# Patient Record
Sex: Male | Born: 1985 | Race: Black or African American | Hispanic: No | Marital: Single | Smoking: Former smoker
Health system: Southern US, Community
[De-identification: ages and names within clinical notes are randomized; demographics above are authoritative.]

## PROBLEM LIST (undated history)

## (undated) DIAGNOSIS — K5190 Ulcerative colitis, unspecified, without complications: Secondary | ICD-10-CM

## (undated) HISTORY — PX: ANKLE SURGERY: SHX546

---
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
## 2007-10-02 ENCOUNTER — Emergency Department (HOSPITAL_COMMUNITY): Admission: EM | Admit: 2007-10-02 | Discharge: 2007-10-02 | Payer: Self-pay | Admitting: Emergency Medicine

## 2008-01-08 ENCOUNTER — Emergency Department (HOSPITAL_COMMUNITY): Admission: EM | Admit: 2008-01-08 | Discharge: 2008-01-08 | Payer: Self-pay | Admitting: Emergency Medicine

## 2008-01-10 ENCOUNTER — Encounter: Payer: Self-pay | Admitting: Gastroenterology

## 2008-01-12 ENCOUNTER — Emergency Department (HOSPITAL_COMMUNITY): Admission: EM | Admit: 2008-01-12 | Discharge: 2008-01-12 | Payer: Self-pay | Admitting: Emergency Medicine

## 2008-01-14 ENCOUNTER — Encounter: Payer: Self-pay | Admitting: Gastroenterology

## 2008-04-03 ENCOUNTER — Emergency Department (HOSPITAL_COMMUNITY): Admission: EM | Admit: 2008-04-03 | Discharge: 2008-04-04 | Payer: Self-pay | Admitting: Emergency Medicine

## 2009-02-23 ENCOUNTER — Emergency Department (HOSPITAL_COMMUNITY): Admission: EM | Admit: 2009-02-23 | Discharge: 2009-02-23 | Payer: Self-pay | Admitting: Emergency Medicine

## 2009-02-26 ENCOUNTER — Emergency Department (HOSPITAL_COMMUNITY): Admission: EM | Admit: 2009-02-26 | Discharge: 2009-02-26 | Payer: Self-pay | Admitting: Emergency Medicine

## 2009-03-03 ENCOUNTER — Emergency Department (HOSPITAL_COMMUNITY): Admission: EM | Admit: 2009-03-03 | Discharge: 2009-03-04 | Payer: Self-pay | Admitting: Emergency Medicine

## 2009-03-05 ENCOUNTER — Ambulatory Visit: Payer: Self-pay | Admitting: Family Medicine

## 2009-03-12 ENCOUNTER — Ambulatory Visit: Payer: Self-pay | Admitting: Family Medicine

## 2009-03-12 DIAGNOSIS — N453 Epididymo-orchitis: Secondary | ICD-10-CM | POA: Insufficient documentation

## 2009-04-22 ENCOUNTER — Telehealth: Payer: Self-pay | Admitting: Gastroenterology

## 2009-07-26 ENCOUNTER — Emergency Department (HOSPITAL_COMMUNITY): Admission: EM | Admit: 2009-07-26 | Discharge: 2009-07-26 | Payer: Self-pay | Admitting: Emergency Medicine

## 2009-08-12 ENCOUNTER — Emergency Department (HOSPITAL_COMMUNITY): Admission: EM | Admit: 2009-08-12 | Discharge: 2009-08-13 | Payer: Self-pay | Admitting: Emergency Medicine

## 2009-11-03 IMAGING — CT CT PELVIS W/ CM
3 of 5 series · 13 of 36 positions shown, 19 images · IV contrast (OMNI 300/WATER & 100 ML OMNI 300)
Comparison: The lung bases are clear.

CT ABDOMEN

CLINICAL DATA: Abdominal pain.  21-year-old with abdominal pain
and blood in stools 3-4 days ago.

CT ABDOMEN AND PELVIS WITH CONTRAST
TECHNIQUE: Multidetector CT imaging of the abdomen and pelvis was
performed using the standard protocol following bolus
administration of intravenous contrast.
Contrast: 100 ml 4mnipaque-SSS none

[Series 2: routine abdomen · axial · 0.70mm/px · z∈[-399,-84]mm · 8 of 83 slices shown, 13 images]
[im 10/83  soft-tissue]
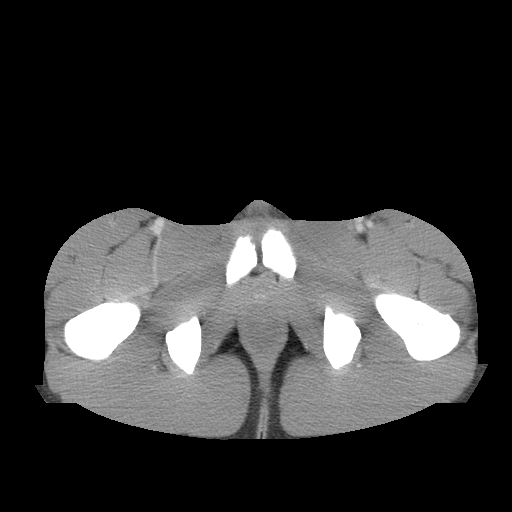
[im 10/83  bone]
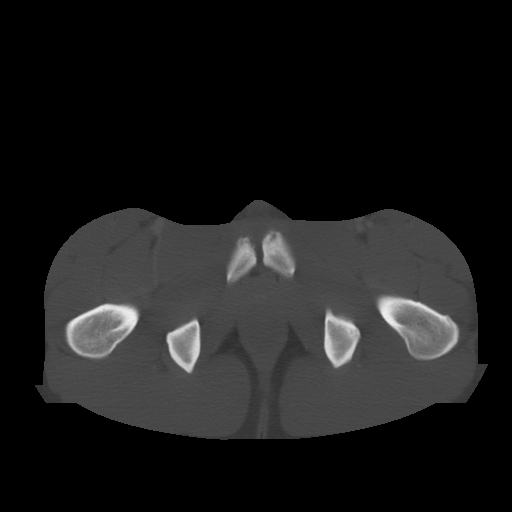
[im 19/83  soft-tissue]
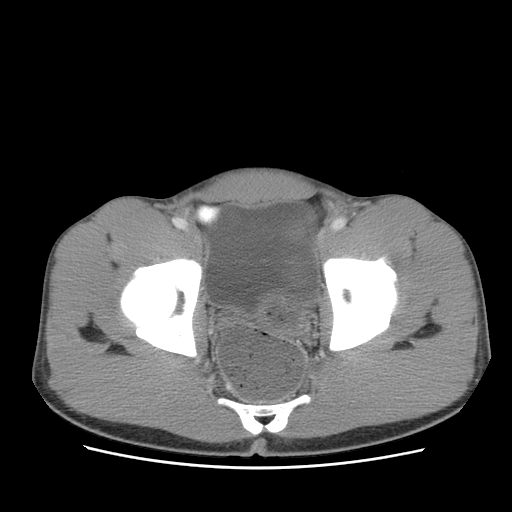
[im 28/83  soft-tissue]
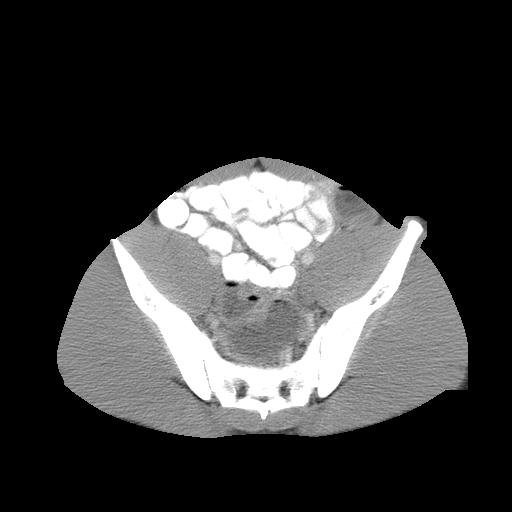
[im 37/83  soft-tissue]
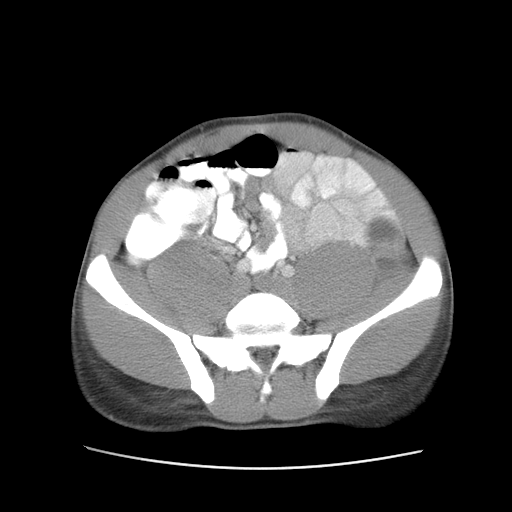
[im 46/83  soft-tissue]
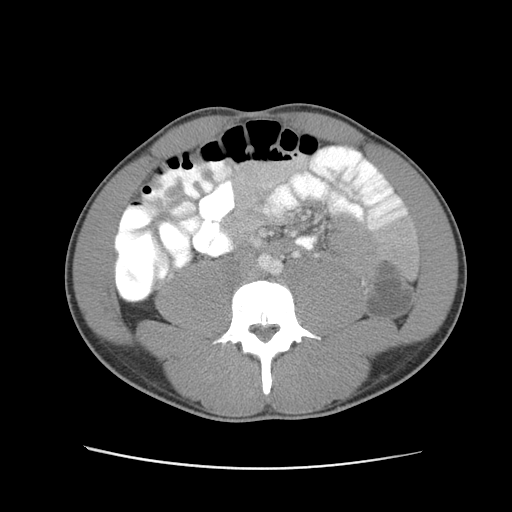
[im 46/83  lung]
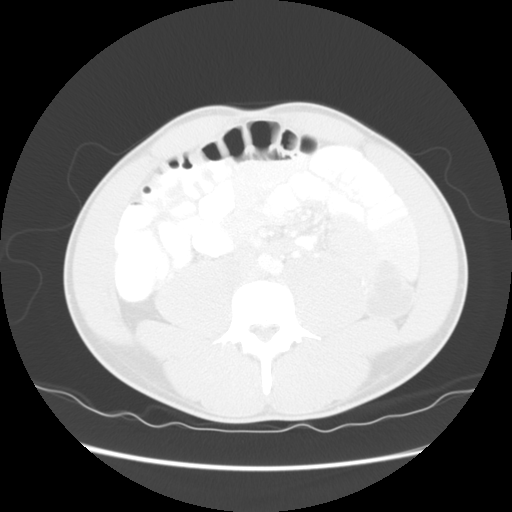
[im 55/83  soft-tissue]
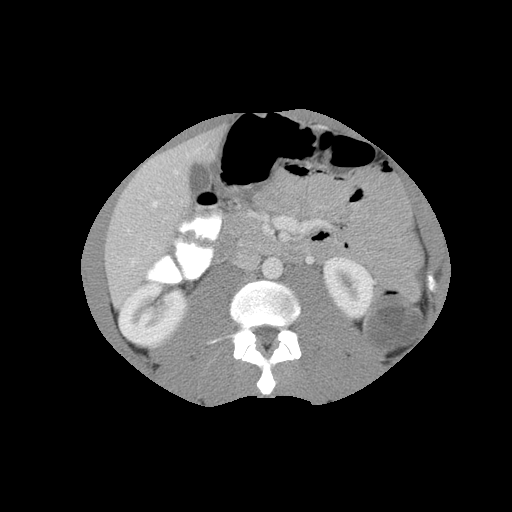
[im 55/83  lung]
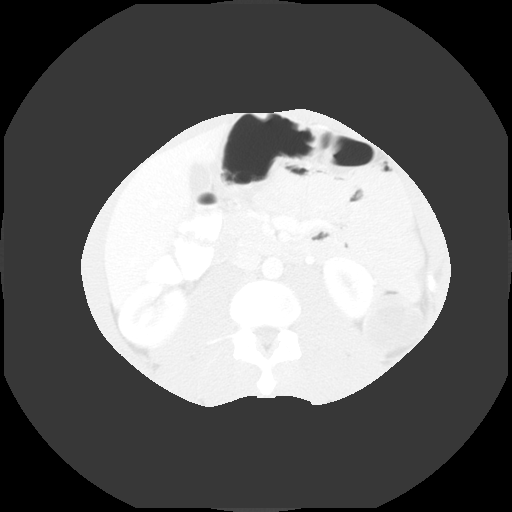
[im 64/83  soft-tissue]
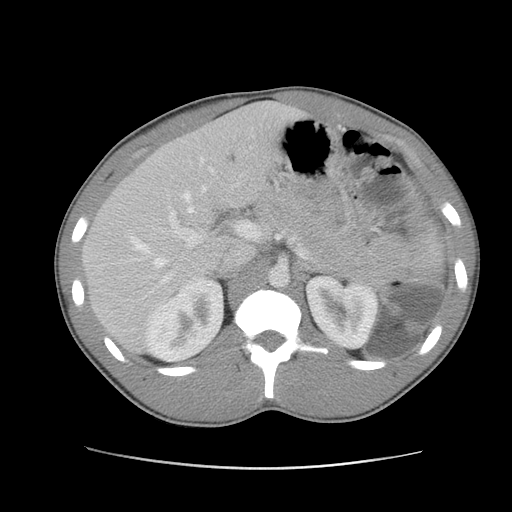
[im 64/83  lung]
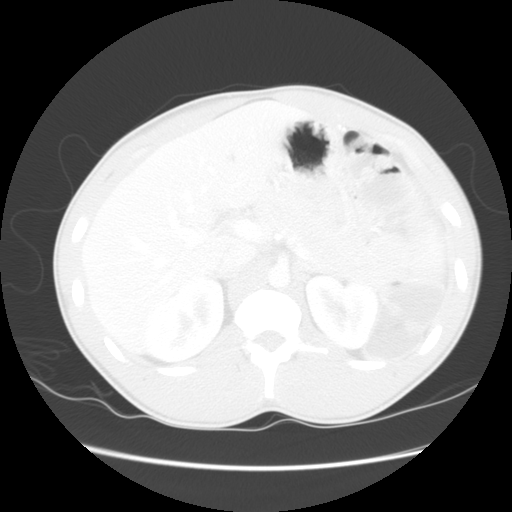
[im 73/83  soft-tissue]
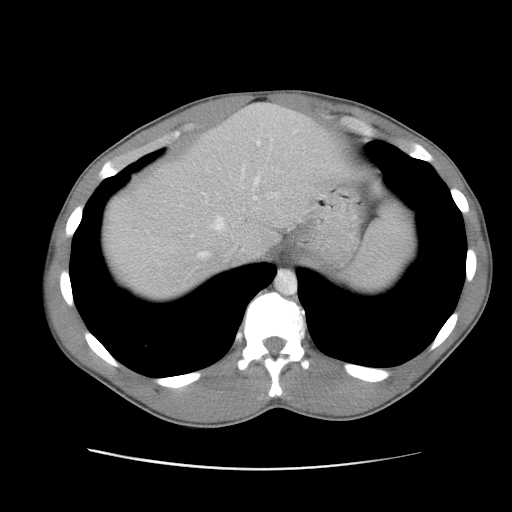
[im 73/83  lung]
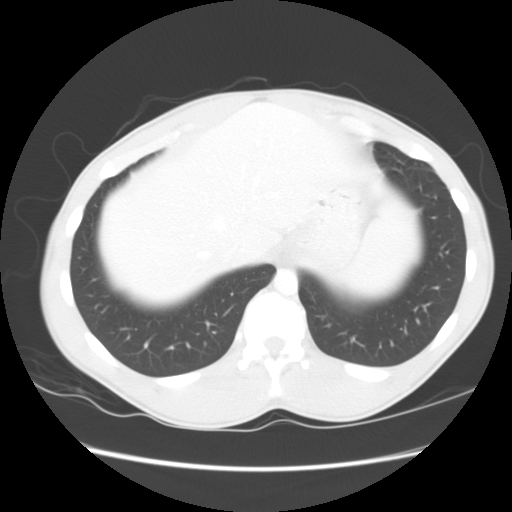

[Series 400: reformatted · sagittal · 0.82mm/px · 1 of 153 slices shown, 2 images (1 of 2)]
[im 51/153  soft-tissue]
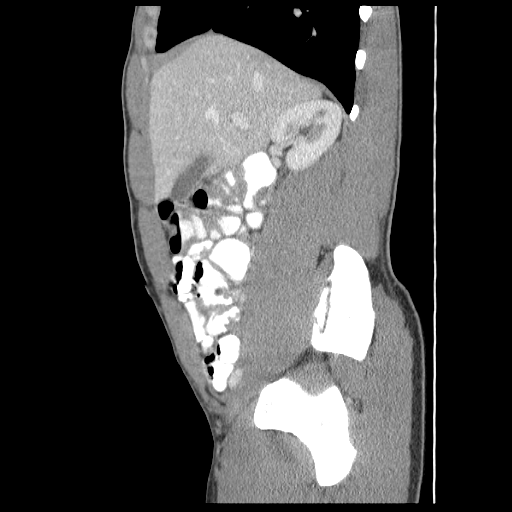
[im 51/153  bone]
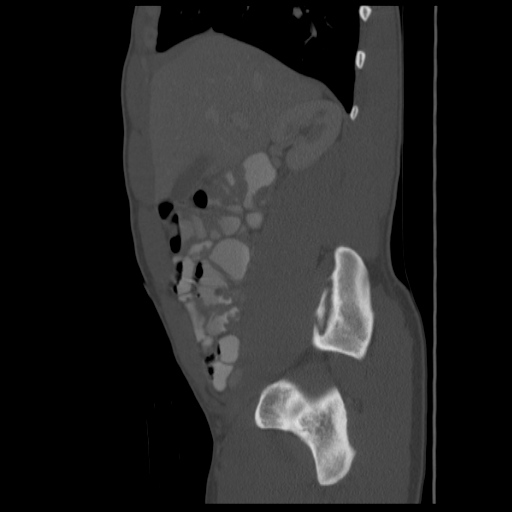

[Series 401: reformatted · coronal · 0.82mm/px · 4 of 129 slices shown (2 of 2)]
[im 10/129  soft-tissue]
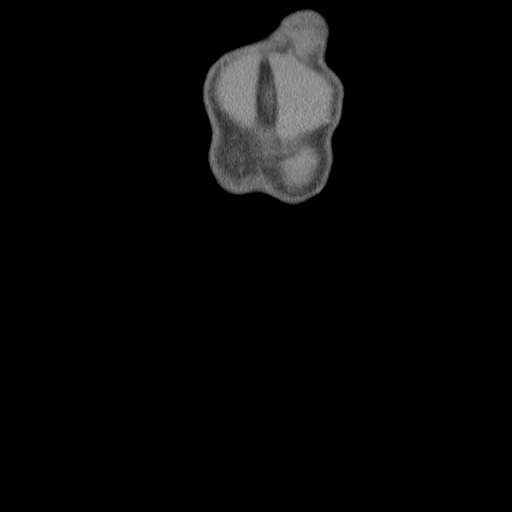
[im 28/129  soft-tissue]
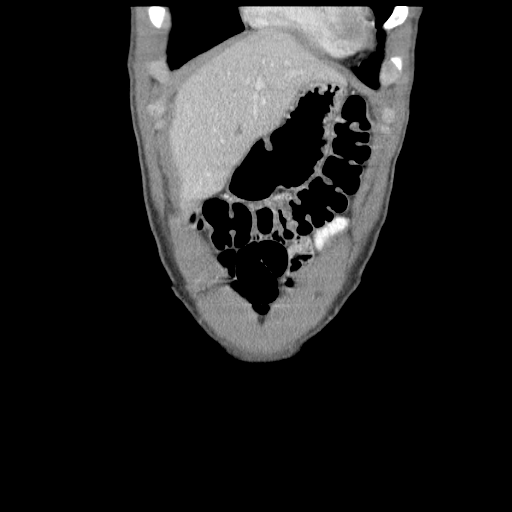
[im 46/129  soft-tissue]
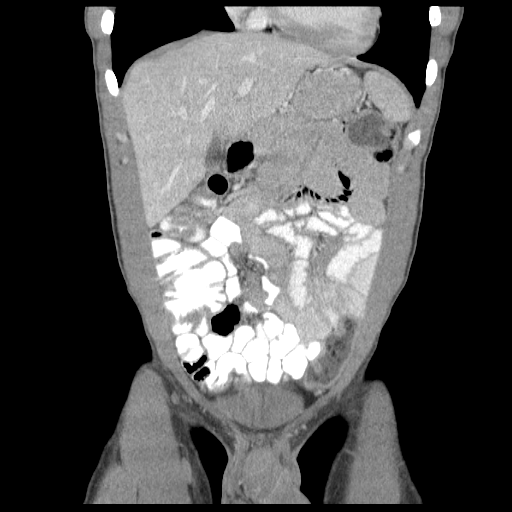
[im 55/129  soft-tissue]
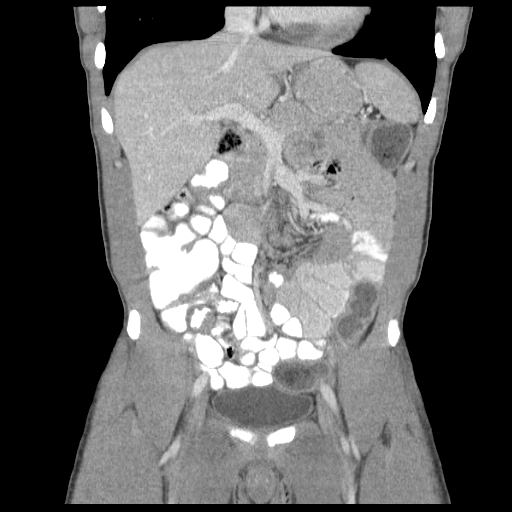

[13 of 36 positions shown; findings below may reference images not displayed]

FINDINGS: The liver, gallbladder, adrenal glands, kidneys, and pancreas are
normal.

Evaluation for acute inflammatory changes is somewhat limited due
to a marked paucity of intra-abdominal fat in this young, thin
patient.

Stomach is unremarkable.  Small bowel loops are normal in caliber
and no bowel wall thickening is appreciated.  The distal transverse
colon through the descending colon do not contain oral contrast but
are fluid-filled, which can be seen in the setting of diarrhea.
There is no colonic wall thickening.  No acute bony abnormalities
appreciated.  Incidentally noted is a limbus vertebra at L5.
IMPRESSION: 1.  Fluid-filled colon, which can be seen in the setting of
diarrhea.  No inflammatory changes related to the bowel are
identified.

CT PELVIS
FINDINGS: The sigmoid colon contains formed stool and fluid but
there is no evidence of wall thickening.  The urinary bladder is
normal.  Pelvic small bowel loops are normal in caliber and wall
thickness.  The appendix is not definitely identified; however no
acute inflammatory changes are appreciated in the right lower
quadrant and to suggest acute appendicitis.  No free fluid or
adenopathy is seen.  The bony pelvis is unremarkable
IMPRESSION: 1.  No acute findings in the pelvis.

## 2010-01-08 ENCOUNTER — Emergency Department (HOSPITAL_COMMUNITY): Admission: EM | Admit: 2010-01-08 | Discharge: 2010-01-08 | Payer: Self-pay | Admitting: Emergency Medicine

## 2011-03-13 IMAGING — US US ART/VEN ABD/PELV/SCROTUM DOPPLER COMPLETE
1 series · 14 of 25 positions shown · non-contrast
Comparison: Pelvic CT 04/04/2008.

CLINICAL DATA: Scrotal pain and swelling.

SCROTAL ULTRASOUND
DOPPLER ULTRASOUND OF THE TESTICLES
TECHNIQUE: Complete ultrasound examination of the testicles,
epididymis, and other scrotal structures was performed.  Color and
spectral Doppler ultrasound were also utilized to evaluate blood
flow to the testicles.

[Series 1: us art/ven abd/pelv/scrotum doppler complete · 0.08mm/px · 14 of 57 slices shown]
[im 1/57]
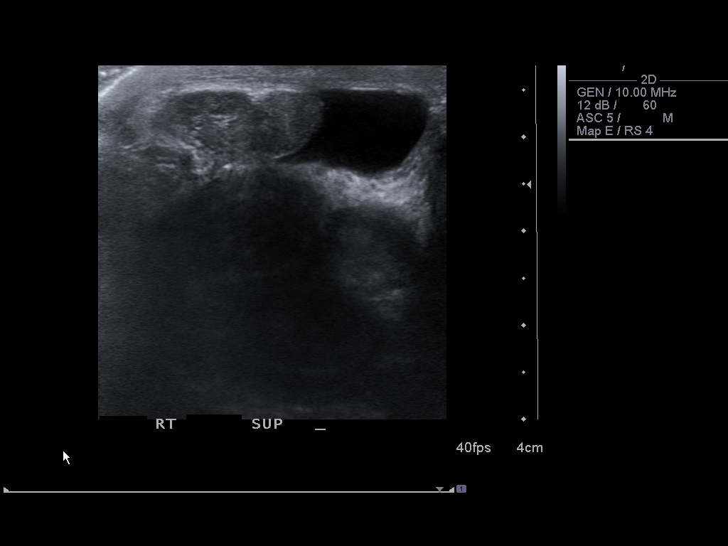
[im 5/57]
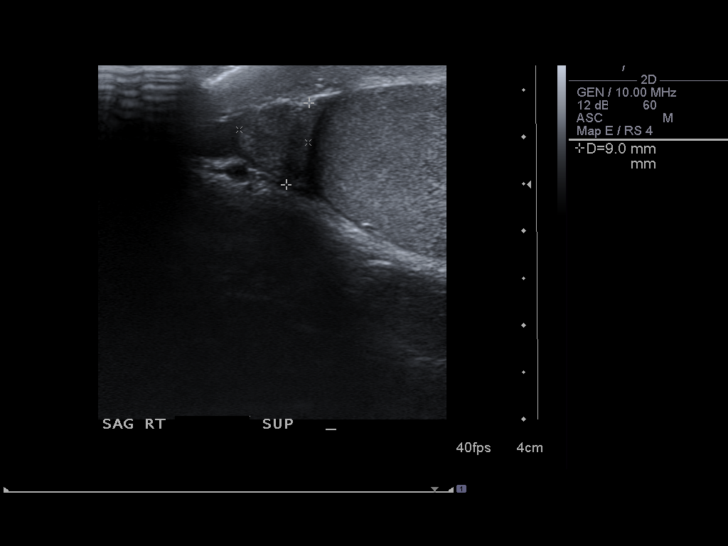
[im 10/57]
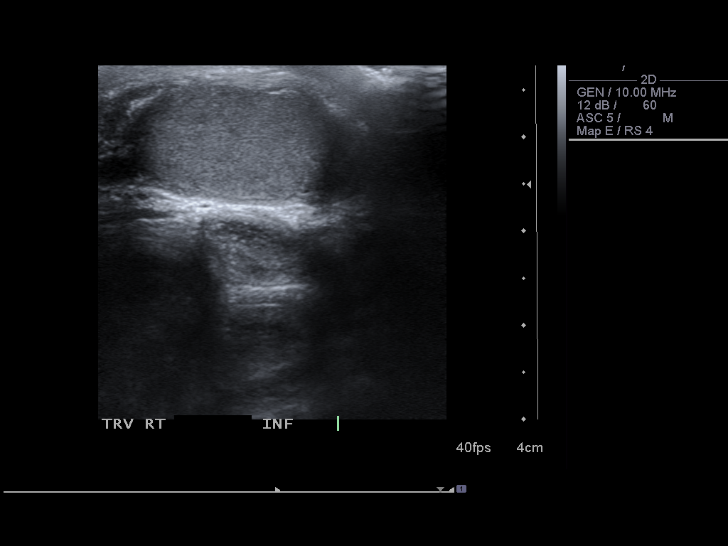
[im 15/57]
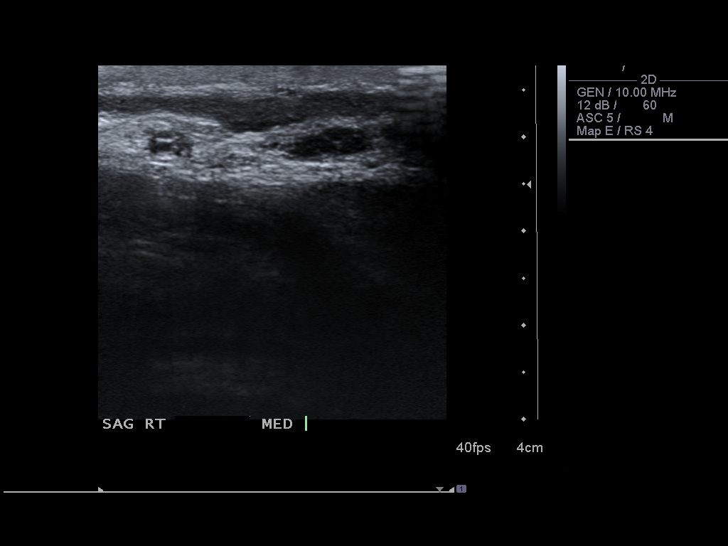
[im 19/57]
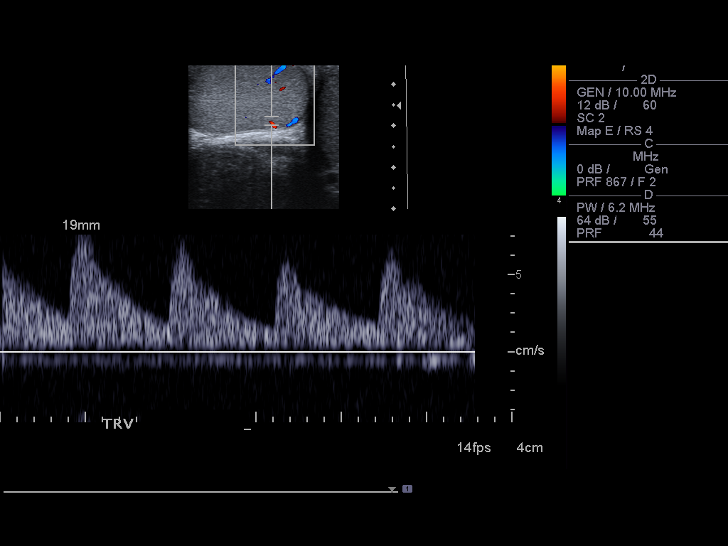
[im 22/57]
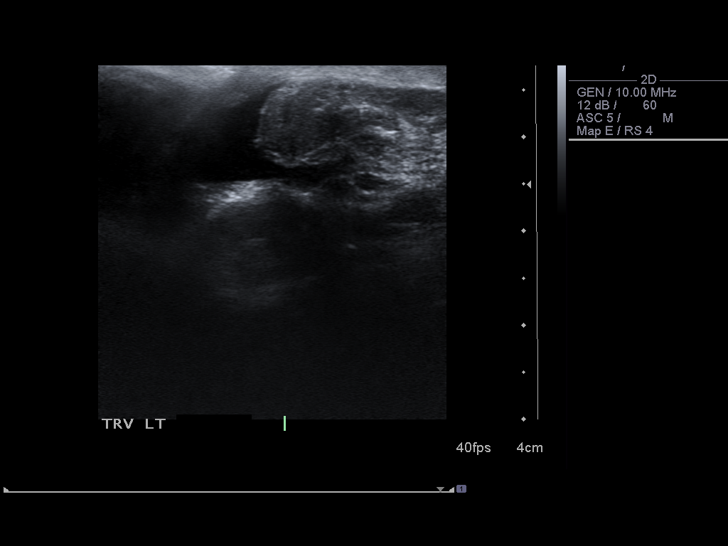
[im 26/57]
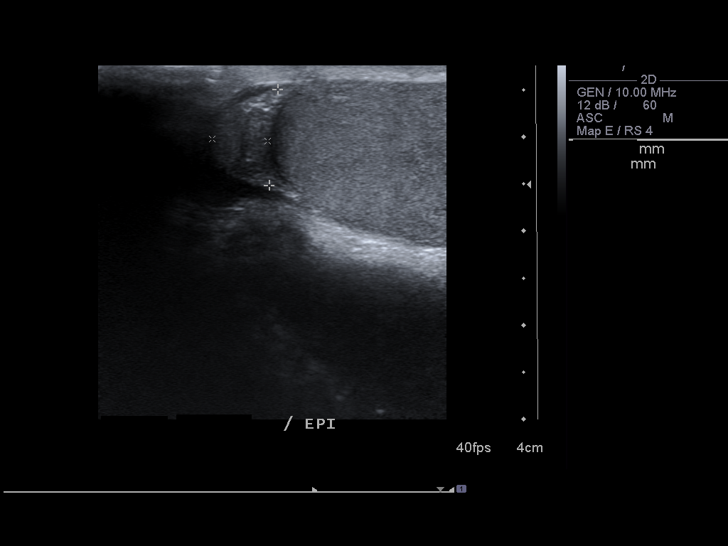
[im 31/57]
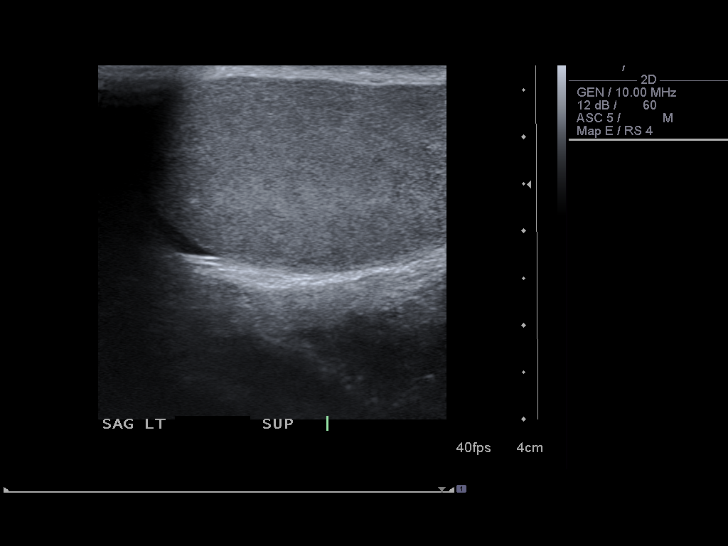
[im 36/57]
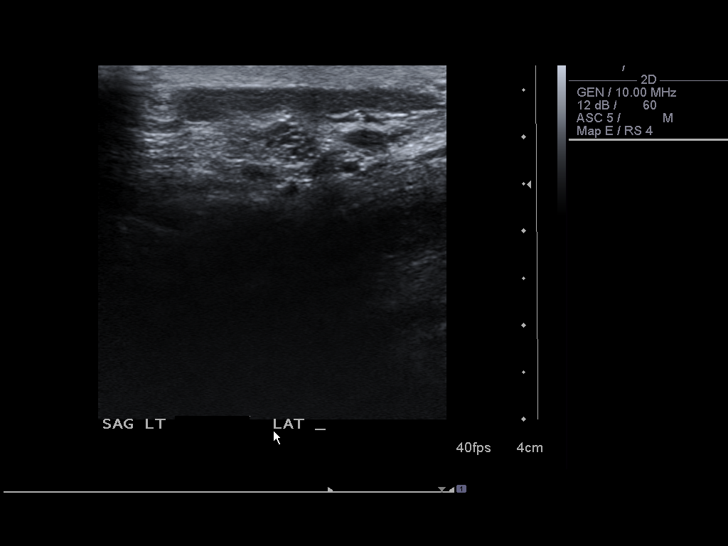
[im 38/57]
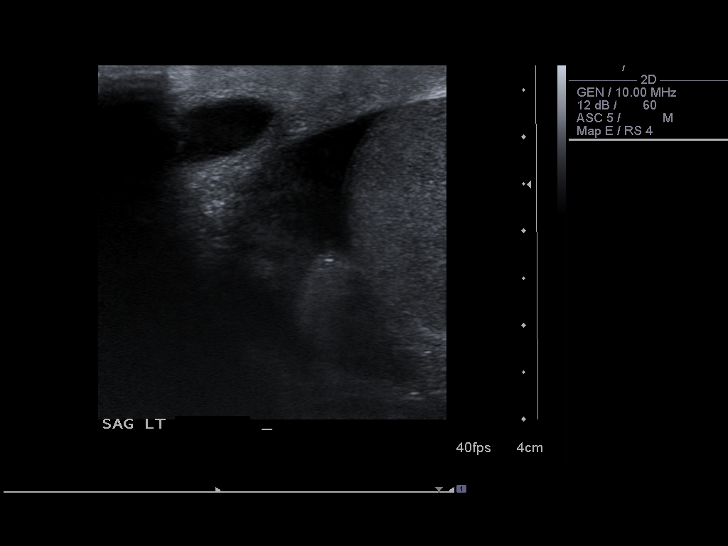
[im 43/57]
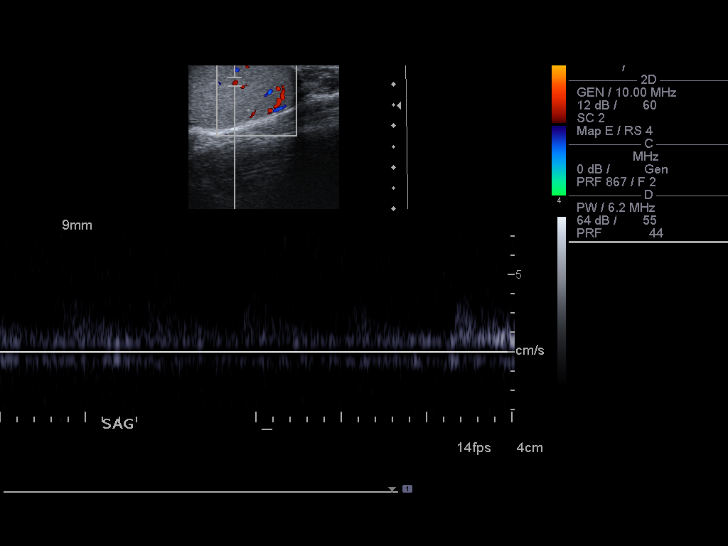
[im 47/57]
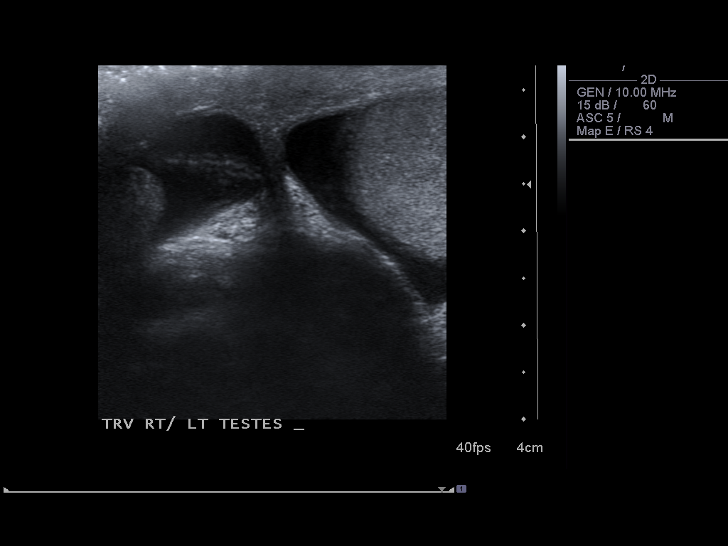
[im 52/57]
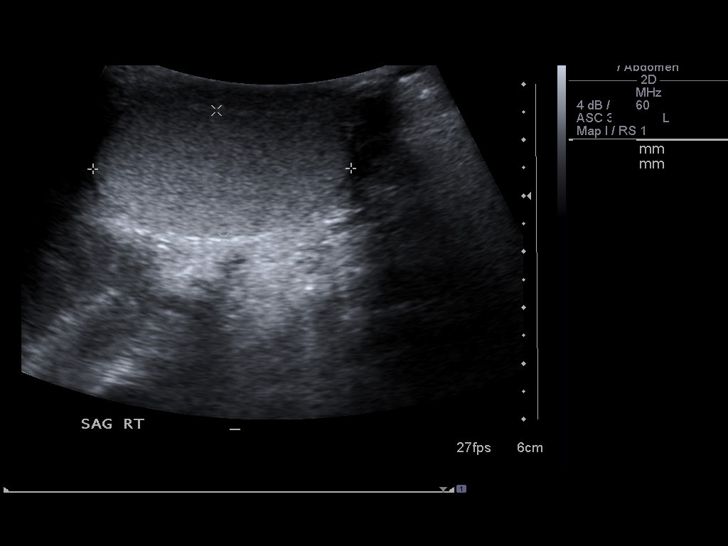
[im 57/57]
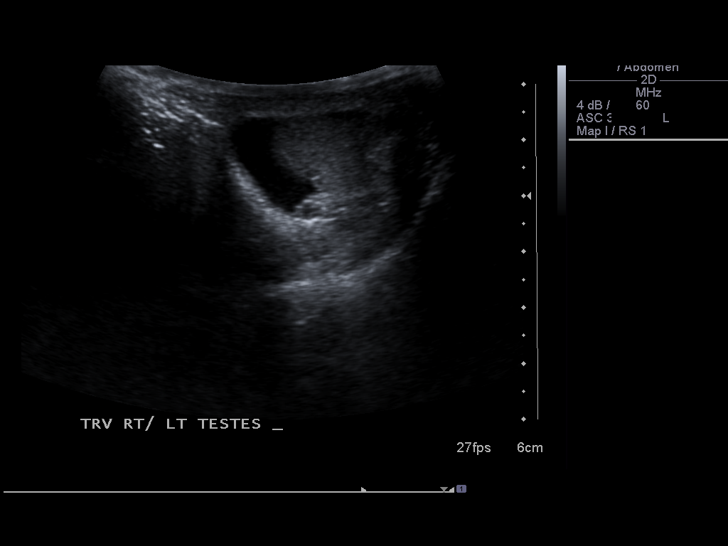

[14 of 25 positions shown; findings below may reference images not displayed]

FINDINGS: The testicles are symmetric in size and echogenicity.
The right testis measures 4.6 x 2.9 x 1.9 cm.  The left testis
measures 4.5 x 2.9 x 2.1 cm.  No testicular masses are seen, and
there is no evidence of microlithiasis.

Both epididymal heads are unremarkable in appearance.  Small
hydroceles are present bilaterally. There is no evidence of
varicocele or other extra-testicular abnormality.

Blood flow is seen within both testicles on color Doppler
sonography.  Doppler spectral waveforms show both arterial and
venous flow signal in both testicles.
IMPRESSION: Small bilateral hydroceles.  Otherwise negative examination.   No
evidence of testicular mass or torsion.

## 2011-03-26 LAB — BASIC METABOLIC PANEL
BUN: 9 mg/dL (ref 6–23)
Calcium: 9.2 mg/dL (ref 8.4–10.5)
Creatinine, Ser: 0.92 mg/dL (ref 0.4–1.5)
GFR calc non Af Amer: >60 mL/min (ref >60)
Glucose, Bld: 89 mg/dL (ref 70–99)
Potassium: 3.3 mEq/L — ABNORMAL LOW (ref 3.5–5.1)

## 2011-03-26 LAB — URINALYSIS, ROUTINE W REFLEX MICROSCOPIC
Bilirubin Urine: NEGATIVE
Glucose, UA: NEGATIVE mg/dL
Hgb urine dipstick: NEGATIVE
Hgb urine dipstick: NEGATIVE
Ketones, ur: NEGATIVE mg/dL
Nitrite: NEGATIVE
Nitrite: NEGATIVE
Protein, ur: NEGATIVE mg/dL
Protein, ur: NEGATIVE mg/dL
Specific Gravity, Urine: 1.023 (ref 1.005–1.030)
Specific Gravity, Urine: 1.015 (ref 1.005–1.030)
Urobilinogen, UA: 1 mg/dL (ref 0.0–1.0)
Urobilinogen, UA: 0.2 mg/dL (ref 0.0–1.0)
pH: 6.5 (ref 5.0–8.0)

## 2011-03-26 LAB — DIFFERENTIAL
Basophils Absolute: 0.1 10*3/uL (ref 0.0–0.1)
Lymphocytes Relative: 37 % (ref 12–46)
Lymphs Abs: 3 10*3/uL (ref 0.7–4.0)
Neutro Abs: 4.1 10*3/uL (ref 1.7–7.7)
Neutrophils Relative %: 51 % (ref 43–77)

## 2011-03-26 LAB — CBC
Platelets: 217 10*3/uL (ref 150–400)
RDW: 11.9 % (ref 11.5–15.5)
WBC: 8 10*3/uL (ref 4.0–10.5)

## 2011-03-26 LAB — GC/CHLAMYDIA PROBE AMP, URINE
Chlamydia, Swab/Urine, PCR: NEGATIVE
GC Probe Amp, Urine: NEGATIVE

## 2011-03-26 LAB — URINE MICROSCOPIC-ADD ON

## 2011-03-31 LAB — CBC
HCT: 41.2 % (ref 39.0–52.0)
HCT: 47.7 % (ref 39.0–52.0)
Hemoglobin: 13.7 g/dL (ref 13.0–17.0)
Hemoglobin: 15.5 g/dL (ref 13.0–17.0)
Hemoglobin: 16.5 g/dL (ref 13.0–17.0)
MCHC: 33.8 g/dL (ref 30.0–36.0)
MCHC: 34.5 g/dL (ref 30.0–36.0)
Platelets: 208 10*3/uL (ref 150–400)
Platelets: 224 10*3/uL (ref 150–400)
RBC: 4.37 MIL/uL (ref 4.22–5.81)
RDW: 11.9 % (ref 11.5–15.5)
RDW: 12.3 % (ref 11.5–15.5)

## 2011-03-31 LAB — DIFFERENTIAL
Basophils Absolute: 0.1 10*3/uL (ref 0.0–0.1)
Basophils Relative: 1 % (ref 0–1)
Eosinophils Absolute: 0.1 10*3/uL (ref 0.0–0.7)
Eosinophils Relative: 0 % (ref 0–5)
Lymphocytes Relative: 31 % (ref 12–46)
Lymphs Abs: 2.1 10*3/uL (ref 0.7–4.0)
Lymphs Abs: 2.9 10*3/uL (ref 0.7–4.0)
Monocytes Absolute: 0.7 10*3/uL (ref 0.1–1.0)
Monocytes Absolute: 1.1 10*3/uL — ABNORMAL HIGH (ref 0.1–1.0)
Monocytes Relative: 17 % — ABNORMAL HIGH (ref 3–12)
Monocytes Relative: 8 % (ref 3–12)
Neutro Abs: 1.8 10*3/uL (ref 1.7–7.7)
Neutro Abs: 2.5 10*3/uL (ref 1.7–7.7)
Neutrophils Relative %: 39 % — ABNORMAL LOW (ref 43–77)
Neutrophils Relative %: 44 % (ref 43–77)
Neutrophils Relative %: 60 % (ref 43–77)

## 2011-03-31 LAB — CLOSTRIDIUM DIFFICILE EIA

## 2011-03-31 LAB — COMPREHENSIVE METABOLIC PANEL
ALT: 16 U/L (ref 0–53)
ALT: 17 U/L (ref 0–53)
Alkaline Phosphatase: 121 U/L — ABNORMAL HIGH (ref 39–117)
BUN: 8 mg/dL (ref 6–23)
BUN: 9 mg/dL (ref 6–23)
CO2: 33 mEq/L — ABNORMAL HIGH (ref 19–32)
Calcium: 9.8 mg/dL (ref 8.4–10.5)
GFR calc non Af Amer: >60 mL/min (ref >60)
Glucose, Bld: 102 mg/dL — ABNORMAL HIGH (ref 70–99)
Glucose, Bld: 89 mg/dL (ref 70–99)
Potassium: 2.7 mEq/L — CL (ref 3.5–5.1)
Sodium: 135 mEq/L (ref 135–145)
Sodium: 137 mEq/L (ref 135–145)
Total Bilirubin: 0.7 mg/dL (ref 0.3–1.2)
Total Protein: 7.6 g/dL (ref 6.0–8.3)

## 2011-03-31 LAB — BASIC METABOLIC PANEL
Chloride: 102 mEq/L (ref 96–112)
GFR calc Af Amer: >60 mL/min (ref >60)
Potassium: 3.6 mEq/L (ref 3.5–5.1)
Sodium: 136 mEq/L (ref 135–145)

## 2011-03-31 LAB — OVA AND PARASITE EXAMINATION

## 2011-03-31 LAB — URINALYSIS, ROUTINE W REFLEX MICROSCOPIC
Bilirubin Urine: NEGATIVE
Hgb urine dipstick: NEGATIVE
Ketones, ur: NEGATIVE mg/dL
Nitrite: NEGATIVE
Specific Gravity, Urine: 1.011 (ref 1.005–1.030)
Urobilinogen, UA: 0.2 mg/dL (ref 0.0–1.0)
pH: 6.5 (ref 5.0–8.0)

## 2011-03-31 LAB — GC/CHLAMYDIA PROBE AMP, URINE: Chlamydia, Swab/Urine, PCR: NEGATIVE

## 2011-03-31 LAB — HEMOCCULT GUIAC POC 1CARD (OFFICE): Fecal Occult Bld: NEGATIVE

## 2011-03-31 LAB — STOOL CULTURE

## 2011-09-09 LAB — DIFFERENTIAL
Basophils Relative: 0
Eosinophils Absolute: 0
Eosinophils Relative: 1
Lymphocytes Relative: 52 — ABNORMAL HIGH
Neutrophils Relative %: 33 — ABNORMAL LOW

## 2011-09-09 LAB — OVA AND PARASITE EXAMINATION

## 2011-09-09 LAB — CBC
MCHC: 34.5
MCV: 89.7
Platelets: 249
RDW: 12.3

## 2011-09-09 LAB — STOOL CULTURE

## 2011-09-09 LAB — COMPREHENSIVE METABOLIC PANEL
AST: 26
Albumin: 3.9
Calcium: 9
Creatinine, Ser: 1.03
GFR calc Af Amer: >60
GFR calc non Af Amer: >60
Total Protein: 7

## 2011-09-13 LAB — DIFFERENTIAL
Lymphocytes Relative: 43
Lymphs Abs: 2.6
Neutro Abs: 2.6
Neutrophils Relative %: 42 — ABNORMAL LOW

## 2011-09-13 LAB — URINALYSIS, ROUTINE W REFLEX MICROSCOPIC
Glucose, UA: NEGATIVE
Protein, ur: NEGATIVE
Urobilinogen, UA: 0.2

## 2011-09-13 LAB — POCT I-STAT, CHEM 8
BUN: 11
Chloride: 104
Creatinine, Ser: 1.2
Potassium: 3.5
Sodium: 139

## 2011-09-13 LAB — CBC
HCT: 44.1
Platelets: 230
WBC: 6.1

## 2011-09-29 LAB — CBC
HCT: 41.3
Hemoglobin: 14.4
MCHC: 34.9
RDW: 12.2

## 2011-09-29 LAB — URINALYSIS, ROUTINE W REFLEX MICROSCOPIC
Bilirubin Urine: NEGATIVE
Hgb urine dipstick: NEGATIVE
Ketones, ur: NEGATIVE
Nitrite: NEGATIVE
pH: 6.5

## 2011-09-29 LAB — DIFFERENTIAL
Basophils Absolute: 0
Basophils Relative: 0
Eosinophils Relative: 3
Lymphocytes Relative: 43
Monocytes Absolute: 0.8 — ABNORMAL HIGH
Monocytes Relative: 15 — ABNORMAL HIGH

## 2011-09-29 LAB — OVA AND PARASITE EXAMINATION

## 2011-09-29 LAB — STOOL CULTURE

## 2011-09-29 LAB — CLOSTRIDIUM DIFFICILE EIA: C difficile Toxins A+B, EIA: NEGATIVE

## 2011-09-29 LAB — BASIC METABOLIC PANEL
CO2: 27
Glucose, Bld: 105 — ABNORMAL HIGH
Potassium: 3.9
Sodium: 137

## 2021-10-23 ENCOUNTER — Other Ambulatory Visit: Payer: Self-pay

## 2021-10-23 ENCOUNTER — Emergency Department (HOSPITAL_BASED_OUTPATIENT_CLINIC_OR_DEPARTMENT_OTHER)
Admission: EM | Admit: 2021-10-23 | Discharge: 2021-10-23 | Disposition: A | Discharge status: Home / Self Care | Payer: Self-pay | Attending: Emergency Medicine | Admitting: Emergency Medicine

## 2021-10-23 ENCOUNTER — Encounter (HOSPITAL_BASED_OUTPATIENT_CLINIC_OR_DEPARTMENT_OTHER): Payer: Self-pay | Admitting: *Deleted

## 2021-10-23 DIAGNOSIS — M791 Myalgia, unspecified site: Secondary | ICD-10-CM | POA: Insufficient documentation

## 2021-10-23 DIAGNOSIS — X500XXA Overexertion from strenuous movement or load, initial encounter: Secondary | ICD-10-CM | POA: Insufficient documentation

## 2021-10-23 DIAGNOSIS — Z20822 Contact with and (suspected) exposure to covid-19: Secondary | ICD-10-CM | POA: Insufficient documentation

## 2021-10-23 DIAGNOSIS — Y990 Civilian activity done for income or pay: Secondary | ICD-10-CM | POA: Insufficient documentation

## 2021-10-23 HISTORY — DX: Ulcerative colitis, unspecified, without complications: K51.90

## 2021-10-23 LAB — RESP PANEL BY RT-PCR (FLU A&B, COVID) ARPGX2
Influenza A by PCR: NEGATIVE
Influenza B by PCR: NEGATIVE
SARS Coronavirus 2 by RT PCR: NEGATIVE

## 2021-10-23 MED ORDER — CYCLOBENZAPRINE HCL 10 MG PO TABS: 10.0000 mg | ORAL_TABLET | Freq: Two times a day (BID) | ORAL | 0 refills | Status: AC | PRN

## 2021-10-23 NOTE — ED Provider Notes (Signed)
[facility] EMERGENCY DEPT Provider Note   CSN: [id number] Arrival date & time: 10/23/21  1823     History Chief Complaint  Patient presents with   Generalized Body Aches    Cameron Johnson is a 35 y.o. male.  HPI  Patient presents with generalized body aches.  Happened acutely when he was moving furniture today at work.  He would like his entire muscles in body with spasming when he is lifting heavy furniture.  Stopping lifting the furniture made it feel better.  He has also tried 2 Advil which also improved his symptoms.  Lifting anything does make it worse.  He apparently does not have any symptoms.  Past Medical History:  Diagnosis Date   Ulcerative colitis [facility])     Patient Active Problem List   Diagnosis Date Noted   EPIDIDYMITIS 03/12/2009    History reviewed. No pertinent surgical history.     History reviewed. No pertinent family history.  Social History   Tobacco Use   Smoking status: Never   Smokeless tobacco: Never  Vaping Use   Vaping Use: Never used  Substance Use Topics   Alcohol use: Yes    Comment: socially    Home Medications Prior to Admission medications   Not on File    Allergies    Patient has no known allergies.  Review of Systems   Review of Systems  Musculoskeletal:  Positive for myalgias.  All other systems reviewed and are negative.  Physical Exam Updated Vital Signs BP 129/78   Pulse 84   Temp 98.2 F (36.8 C)   Resp 16   Ht 5\' 9"  (1.753 m)   Wt 77.1 kg   SpO2 100%   BMI 25.10 kg/m   Physical Exam Vitals and nursing note reviewed. Exam conducted with a chaperone present.  Constitutional:      General: He is not in acute distress.    Appearance: Normal appearance.  HENT:     Head: Normocephalic and atraumatic.  Eyes:     General: No scleral icterus.    Extraocular Movements: Extraocular movements intact.     Pupils: Pupils are equal, round, and reactive to light.  Cardiovascular:     Rate  and Rhythm: Normal rate and regular rhythm.  Pulmonary:     Effort: Pulmonary effort is normal.     Breath sounds: Normal breath sounds.  Abdominal:     General: Abdomen is flat.     Tenderness: There is no abdominal tenderness.  Musculoskeletal:        General: No tenderness or signs of injury. Normal range of motion.  Skin:    Coloration: Skin is not jaundiced.  Neurological:     Mental Status: He is alert and oriented to person, place, and time. Mental status is at baseline.     Cranial Nerves: No cranial nerve deficit.     Coordination: Coordination normal.     Comments: Cranial nerves II through XII are grossly intact.    ED Results / Procedures / Treatments   Labs (all labs ordered are listed, but only abnormal results are displayed) Labs Reviewed  RESP PANEL BY RT-PCR (FLU A&B, COVID) [id number]    EKG None  Radiology No results found.  Procedures Procedures   Medications Ordered in ED Medications - No data to display  ED Course  I have reviewed the triage vital signs and the nursing notes.  Pertinent labs & imaging results that were available during my care of the  patient were reviewed by me and considered in my medical decision making (see chart for details).    MDM Rules/Calculators/A&P                           Vital signs are stable, patient nontoxic-appearing.  No focal deficits on neuro exam.  No specific abdominal pain.  He does report spasming muscle symptoms after lifting heavy objects.  I suspect he likely has some muscle strain.  There is no midline tenderness on exam, do not suspect acute back injury.  No cauda equina symptoms.  Discussed with the patient, he is agreeable to muscle relaxer trial and information about primary care doctor.  Patient discharged in stable position.  Final Clinical Impression(s) / ED Diagnoses Final diagnoses:  None    Rx / DC Orders ED Discharge Orders     None        Sherrill Raring, PA-C 10/23/21 2201     Fredia Sorrow, MD 11/05/21 [phone]

## 2021-10-23 NOTE — Discharge Instructions (Addendum)
Take Flexeril as needed for muscle spasming, take it at night as it can impair your ability to drive. Follow-up with your primary care doctor, information is provided above.  You should establish care with somebody in the area since you moved back.

## 2021-10-23 NOTE — ED Triage Notes (Signed)
Generalized spasm today while moving furniture.

## 2021-12-28 ENCOUNTER — Encounter (HOSPITAL_BASED_OUTPATIENT_CLINIC_OR_DEPARTMENT_OTHER): Payer: Self-pay | Admitting: Obstetrics and Gynecology

## 2021-12-28 ENCOUNTER — Emergency Department (HOSPITAL_BASED_OUTPATIENT_CLINIC_OR_DEPARTMENT_OTHER)
Admission: EM | Admit: 2021-12-28 | Discharge: 2021-12-28 | Disposition: A | Discharge status: Home / Self Care | Payer: Self-pay | Attending: Emergency Medicine | Admitting: Emergency Medicine

## 2021-12-28 ENCOUNTER — Other Ambulatory Visit: Payer: Self-pay

## 2021-12-28 DIAGNOSIS — K0889 Other specified disorders of teeth and supporting structures: Secondary | ICD-10-CM | POA: Insufficient documentation

## 2021-12-28 MED ORDER — AMOXICILLIN-POT CLAVULANATE 875-125 MG PO TABS: 1.0000 | ORAL_TABLET | Freq: Two times a day (BID) | ORAL | 0 refills | Status: AC

## 2021-12-28 NOTE — Discharge Instructions (Addendum)
I provided you multiple dental resources that should be included in your paperwork.  I have also given you a referral to our dentist who is on-call, Dr. Mia Creek.  Please call these numbers and find out what option will work best for you. I prescribed you antibiotics for 10 days.  Please take these as prescribed for the entire course.

## 2021-12-28 NOTE — ED Provider Notes (Signed)
[facility] EMERGENCY DEPT Provider Note   CSN: [id number] Arrival date & time: 12/28/21  1756     History  Chief Complaint  Patient presents with   Dental Pain    Cameron Johnson is a 36 y.o. male.  Patient presents to the ED with dental pain.  He states that he just moved here from [place] and does not currently have a dentist.  He has 2 cavities in his mouth.  1 in the right lower molar.  The other is in the upper left molar.  He is having pretty bad dental pain with some mild swelling of his right jaw.  He thinks he could have an abscess formed.  Not had any fevers or chills.  No other symptoms.   Dental Pain     Home Medications Prior to Admission medications   Medication Sig Start Date End Date Taking? Authorizing Provider  amoxicillin-clavulanate (AUGMENTIN) 875-125 MG tablet Take 1 tablet by mouth every 12 (twelve) hours for 10 days. 12/28/21 01/07/22 Yes Marylen Zuk, Finis Bud, PA-C  cyclobenzaprine (FLEXERIL) 10 MG tablet Take 1 tablet (10 mg total) by mouth 2 (two) times daily as needed for muscle spasms. 10/23/21   Theron Arista, PA-C      Allergies    Patient has no known allergies.    Review of Systems   Review of Systems  HENT:  Positive for dental problem.   All other systems reviewed and are negative.  Physical Exam Updated Vital Signs BP (!) 140/91 (BP Location: Right Arm)    Pulse 66    Temp 98.2 F (36.8 C) (Oral)    Resp 13    Ht 5\' 8"  (1.727 m)    Wt 72.6 kg    SpO2 100%    BMI 24.33 kg/m  Physical Exam Vitals and nursing note reviewed.  Constitutional:      General: He is not in acute distress.    Appearance: Normal appearance. He is well-developed. He is not ill-appearing, toxic-appearing or diaphoretic.  HENT:     Head: Normocephalic and atraumatic.     Nose: No nasal deformity.     Mouth/Throat:     Lips: Pink. No lesions.      Comments: See above diagram for the 2 areas of cavitation.  I cannot visualize pulp, however dentin is  present.  Tender to the touch.  He may be has some mild facial swelling on the right side, however I cannot palpate a abscess. Eyes:     General: Gaze aligned appropriately. No scleral icterus.       Right eye: No discharge.        Left eye: No discharge.     Conjunctiva/sclera: Conjunctivae normal.     Right eye: Right conjunctiva is not injected. No exudate or hemorrhage.    Left eye: Left conjunctiva is not injected. No exudate or hemorrhage. Pulmonary:     Effort: Pulmonary effort is normal. No respiratory distress.  Skin:    General: Skin is warm and dry.  Neurological:     Mental Status: He is alert and oriented to person, place, and time.  Psychiatric:        Mood and Affect: Mood normal.        Speech: Speech normal.        Behavior: Behavior normal. Behavior is cooperative.    ED Results / Procedures / Treatments   Labs (all labs ordered are listed, but only abnormal results are displayed) Labs Reviewed - No  data to display  EKG None  Radiology No results found.  Procedures Procedures   Medications Ordered in ED Medications - No data to display  ED Course/ Medical Decision Making/ A&P                           Medical Decision Making Problems Addressed: Pain, dental: acute illness or injury  Risk OTC drugs. Prescription drug management.   This is a 36 year old male who presents the ED with 2 cavities causing him dental pain.  I cannot visualize or palpate an abscess, however he does have some mild right cheek swelling.  He is afebrile and vitals are stable.  I did have dental resources and will discharge home on antibiotics.   Final Clinical Impression(s) / ED Diagnoses Final diagnoses:  Pain, dental    Rx / DC Orders ED Discharge Orders          Ordered    amoxicillin-clavulanate (AUGMENTIN) 875-125 MG tablet  Every 12 hours        12/28/21 1904              Therese Sarah 12/28/21 1909    Cathren Laine, MD 12/28/21  2128

## 2021-12-28 NOTE — ED Triage Notes (Signed)
Patient reports to the ER for left sided dental/jaw pain. Patient reports he recently moved back from [place] and has not yet established PCP/dental

## 2022-02-18 ENCOUNTER — Other Ambulatory Visit: Payer: Self-pay

## 2022-02-18 ENCOUNTER — Encounter (HOSPITAL_BASED_OUTPATIENT_CLINIC_OR_DEPARTMENT_OTHER): Payer: Self-pay | Admitting: Emergency Medicine

## 2022-02-18 ENCOUNTER — Emergency Department (HOSPITAL_BASED_OUTPATIENT_CLINIC_OR_DEPARTMENT_OTHER)
Admission: EM | Admit: 2022-02-18 | Discharge: 2022-02-18 | Disposition: A | Discharge status: Home / Self Care | Payer: Self-pay | Attending: Emergency Medicine | Admitting: Emergency Medicine

## 2022-02-18 ENCOUNTER — Emergency Department (HOSPITAL_BASED_OUTPATIENT_CLINIC_OR_DEPARTMENT_OTHER): Payer: Self-pay

## 2022-02-18 DIAGNOSIS — K51911 Ulcerative colitis, unspecified with rectal bleeding: Secondary | ICD-10-CM | POA: Insufficient documentation

## 2022-02-18 LAB — URINALYSIS, ROUTINE W REFLEX MICROSCOPIC
Bilirubin Urine: NEGATIVE
Glucose, UA: NEGATIVE mg/dL
Hgb urine dipstick: NEGATIVE
Ketones, ur: NEGATIVE mg/dL
Leukocytes,Ua: NEGATIVE
Nitrite: NEGATIVE
Protein, ur: NEGATIVE mg/dL
Specific Gravity, Urine: 1.017 (ref 1.005–1.030)
pH: 6.5 (ref 5.0–8.0)

## 2022-02-18 LAB — COMPREHENSIVE METABOLIC PANEL
ALT: 20 U/L (ref 0–44)
AST: 26 U/L (ref 15–41)
Albumin: 4.2 g/dL (ref 3.5–5.0)
Alkaline Phosphatase: 71 U/L (ref 38–126)
Anion gap: 8 (ref 5–15)
BUN: 14 mg/dL (ref 6–20)
CO2: 27 mmol/L (ref 22–32)
Calcium: 9.5 mg/dL (ref 8.9–10.3)
Chloride: 105 mmol/L (ref 98–111)
Creatinine, Ser: 1.04 mg/dL (ref 0.61–1.24)
GFR, Estimated: >60 mL/min (ref >60)
Glucose, Bld: 82 mg/dL (ref 70–99)
Potassium: 3.7 mmol/L (ref 3.5–5.1)
Sodium: 140 mmol/L (ref 135–145)
Total Bilirubin: 0.4 mg/dL (ref 0.3–1.2)
Total Protein: 7.1 g/dL (ref 6.5–8.1)

## 2022-02-18 LAB — CBC
HCT: 39.3 % (ref 39.0–52.0)
Hemoglobin: 13.2 g/dL (ref 13.0–17.0)
MCH: 29.8 pg (ref 26.0–34.0)
MCHC: 33.6 g/dL (ref 30.0–36.0)
MCV: 88.7 fL (ref 80.0–100.0)
Platelets: 233 10*3/uL (ref 150–400)
RBC: 4.43 MIL/uL (ref 4.22–5.81)
RDW: 11.9 % (ref 11.5–15.5)
WBC: 6.8 10*3/uL (ref 4.0–10.5)
nRBC: 0 % (ref 0.0–0.2)

## 2022-02-18 LAB — LIPASE, BLOOD: Lipase: 23 U/L (ref 11–51)

## 2022-02-18 MED ORDER — PREDNISONE 10 MG PO TABS: ORAL_TABLET | ORAL | 0 refills | Status: DC

## 2022-02-18 MED ORDER — IOHEXOL 300 MG/ML  SOLN
100.0000 mL | Freq: Once | INTRAMUSCULAR | Status: AC | PRN
  Administered 2022-02-18: 100 mL via INTRAVENOUS

## 2022-02-18 MED ORDER — PREDNISONE 20 MG PO TABS
40.0000 mg | ORAL_TABLET | Freq: Once | ORAL | Status: AC
  Administered 2022-02-18: 40 mg via ORAL
  Filled 2022-02-18: qty 2

## 2022-02-18 MED ORDER — IOHEXOL 9 MG/ML PO SOLN
500.0000 mL | ORAL | Status: AC
  Administered 2022-02-18 (×2): 500 mL via ORAL

## 2022-02-18 NOTE — ED Provider Notes (Signed)
[facility] EMERGENCY DEPT Provider Note   CSN: [id number] Arrival date & time: 02/18/22  1658     History  Chief Complaint  Patient presents with   Diarrhea   Abdominal Pain    Cameron Johnson is a 36 y.o. male.  He has a history of ulcerative colitis and takes mesalamine.  He just moved from [place].  Complaining of 2 weeks of lower abdominal pain and some mucus and blood in his stool.  Worse over the last few days.  Feels this is consistent with his ulcerative colitis flare.  He does not have an gastroenterologist, but his insurance starts in April and will be following up in [place].  He is hoping to get started on some steroids as that is what usually helps him.  No fevers or chills.  Rates the pain as 8 out of 10.  The history is provided by the patient.  Abdominal Pain Pain location:  LLQ Pain quality: aching   Pain radiates to:  Does not radiate Pain severity:  Severe Onset quality:  Gradual Duration:  2 weeks Timing:  Intermittent Progression:  Worsening Chronicity:  Recurrent Context: not trauma   Relieved by:  None tried Worsened by:  Nothing Ineffective treatments:  None tried Associated symptoms: hematochezia   Associated symptoms: no chest pain, no cough, no dysuria, no fever, no nausea, no shortness of breath, no sore throat and no vomiting       Home Medications Prior to Admission medications   Medication Sig Start Date End Date Taking? Authorizing Provider  cyclobenzaprine (FLEXERIL) 10 MG tablet Take 1 tablet (10 mg total) by mouth 2 (two) times daily as needed for muscle spasms. 10/23/21   Sherrill Raring, PA-C      Allergies    Patient has no known allergies.    Review of Systems   Review of Systems  Constitutional:  Negative for fever.  HENT:  Negative for sore throat.   Respiratory:  Negative for cough and shortness of breath.   Cardiovascular:  Negative for chest pain.  Gastrointestinal:  Positive for blood in stool and  hematochezia. Negative for abdominal pain, nausea, rectal pain and vomiting.  Genitourinary:  Negative for dysuria.  Skin:  Negative for rash.  Neurological:  Negative for headaches.   Physical Exam Updated Vital Signs BP (!) 151/87 (BP Location: Right Arm)    Pulse (!) 59    Temp 98.4 F (36.9 C) (Oral)    Resp 18    Ht 5\' 8"  (1.727 m)    Wt 77.1 kg    SpO2 100%    BMI 25.85 kg/m  Physical Exam Vitals and nursing note reviewed.  Constitutional:      General: He is not in acute distress.    Appearance: Normal appearance. He is well-developed.  HENT:     Head: Normocephalic and atraumatic.  Eyes:     Conjunctiva/sclera: Conjunctivae normal.  Cardiovascular:     Rate and Rhythm: Normal rate and regular rhythm.     Heart sounds: No murmur heard. Pulmonary:     Effort: Pulmonary effort is normal. No respiratory distress.     Breath sounds: Normal breath sounds.  Abdominal:     Palpations: Abdomen is soft.     Tenderness: There is no abdominal tenderness. There is no guarding or rebound.  Musculoskeletal:        General: No swelling.     Cervical back: Neck supple.  Skin:    General: Skin is warm  and dry.     Capillary Refill: Capillary refill takes less than 2 seconds.  Neurological:     General: No focal deficit present.     Mental Status: He is alert.    ED Results / Procedures / Treatments   Labs (all labs ordered are listed, but only abnormal results are displayed) Labs Reviewed  LIPASE, BLOOD  COMPREHENSIVE METABOLIC PANEL  CBC  URINALYSIS, ROUTINE W REFLEX MICROSCOPIC    EKG None  Radiology CT Abdomen Pelvis W Contrast  Result Date: 02/18/2022 CLINICAL DATA:  Left lower quadrant abdominal pain. History of ulcerative colitis. EXAM: CT ABDOMEN AND PELVIS WITH CONTRAST TECHNIQUE: Multidetector CT imaging of the abdomen and pelvis was performed using the standard protocol following bolus administration of intravenous contrast. RADIATION DOSE REDUCTION: This exam  was performed according to the departmental dose-optimization program which includes automated exposure control, adjustment of the mA and/or kV according to patient size and/or use of iterative reconstruction technique. CONTRAST:  149mL OMNIPAQUE IOHEXOL 300 MG/ML  SOLN COMPARISON:  Report only CT abdomen and pelvis 04/04/2008. FINDINGS: Lower chest: No acute abnormality. Hepatobiliary: No focal liver abnormality is seen. No gallstones, gallbladder wall thickening, or biliary dilatation. Pancreas: Unremarkable. No pancreatic ductal dilatation or surrounding inflammatory changes. Spleen: Normal in size without focal abnormality. Adrenals/Urinary Tract: Adrenal glands are unremarkable. Kidneys are normal, without renal calculi, focal lesion, or hydronephrosis. Bladder is unremarkable. Stomach/Bowel: Stomach is within normal limits. Appendix appears normal. No evidence of bowel wall thickening, distention, or inflammatory changes. There is some air-fluid levels within proximal small bowel loops. Can not exclude wall thickening of the descending colon, sigmoid colon and rectum versus normal under distension. Vascular/Lymphatic: No significant vascular findings are present. No enlarged abdominal or pelvic lymph nodes. Reproductive: Prostate is unremarkable. Other: There is trace free fluid in the pelvis. No abdominal wall hernia. Musculoskeletal: No acute fracture. IMPRESSION: 1. Small amount of free fluid in the pelvis of uncertain etiology, but abnormal in this male patient. 2. Air-fluid levels in the proximal small bowel may represent enteritis. 3. Can not exclude wall thickening of the descending colon to the level of the rectum versus normal under distension. Correlate for colitis. Electronically Signed   By: Ronney Asters M.D.   On: 02/18/2022 20:12    Procedures Procedures    Medications Ordered in ED Medications  iohexol (OMNIPAQUE) 9 MG/ML oral solution 500 mL (500 mLs Oral Contrast Given 02/18/22 1907)   iohexol (OMNIPAQUE) 300 MG/ML solution 100 mL (100 mLs Intravenous Contrast Given 02/18/22 1953)  predniSONE (DELTASONE) tablet 40 mg (40 mg Oral Given 02/18/22 2120)    ED Course/ Medical Decision Making/ A&P Clinical Course as of 02/19/22 0947  Fri Feb 18, 2022  2059 Discussed with Dr. Almyra Free GI.  He said ideally patient would follow-up sooner than 1 month.  But can do prednisone taper. [MB]    Clinical Course User Index [MB] Hayden Rasmussen, MD                           Medical Decision Making Amount and/or Complexity of Data Reviewed Labs: ordered. Radiology: ordered.  Risk Prescription drug management.  This patient complains of lower abdominal pain rectal bleeding and mucus; this involves an extensive number of treatment Options and is a complaint that carries with it a high risk of complications and morbidity. The differential includes ulcerative colitis, colitis, infection, diverticulitis, perforation  I ordered, reviewed and interpreted labs, which included  CBC with normal white count normal hemoglobin, chemistries and LFTs normal, urinalysis without signs of infection I ordered medication oral prednisone and reviewed PMP when indicated. I ordered imaging studies which included CT abdomen and pelvis and I independently    visualized and interpreted imaging which showed some free fluid and air-fluid levels, no abscess or perforation Additional history obtained from patient's family member Previous records obtained and reviewed in epic no recent visits I consulted Dr. Almyra Free gastroenterology and discussed lab and imaging findings and discussed disposition.  Social determinants considered, patient has lack of insurance which will make follow-up difficult Critical Interventions: None  After the interventions stated above, I reevaluated the patient and found patient of benign abdominal exam and stable vitals Admission and further testing considered, no indications for admission at  this time.  Will place on steroid taper for ulcerative colitis flare.  Will need follow-up with GI contact information.  Return instructions discussed          Final Clinical Impression(s) / ED Diagnoses Final diagnoses:  Ulcerative colitis with rectal bleeding, unspecified location [facility])    Rx / DC Orders ED Discharge Orders          Ordered    predniSONE (DELTASONE) 10 MG tablet        02/18/22 2111              Hayden Rasmussen, MD 02/19/22 [phone]

## 2022-02-18 NOTE — ED Notes (Signed)
Pt given oral contrast to drink at 1755; plan to perform CT scan at 1945 ? ?

## 2022-02-18 NOTE — ED Triage Notes (Signed)
Pt via pov from home with loose, bloody stool and lower abdominal pain x 2 weeks that seems to be getting progressively worse. Pt has hx of UC  - has not had a flare up in 4 years. Pt is on apriso, which he takes daily. He states that normally when he has a flare up he gets a taper of prednisone. Pt alert & oriented, nad noted.  ?

## 2022-02-18 NOTE — ED Notes (Signed)
CT will get pt at 1945 for scan ?

## 2022-02-18 NOTE — ED Notes (Signed)
Pt discharged home after verbalizing understanding of discharge instructions; nad noted. 

## 2022-02-18 NOTE — ED Notes (Signed)
RN provided AVS using Teachback Method. Patient verbalizes understanding of Discharge Instructions. Opportunity for Questioning and Answers were provided by RN. Patient Discharged from ED.  ° °

## 2023-07-06 ENCOUNTER — Ambulatory Visit
Admission: EM | Admit: 2023-07-06 | Discharge: 2023-07-06 | Disposition: A | Discharge status: Home / Self Care | Payer: BC Managed Care – PPO | Attending: Internal Medicine | Admitting: Internal Medicine

## 2023-07-06 DIAGNOSIS — R1013 Epigastric pain: Secondary | ICD-10-CM | POA: Diagnosis not present

## 2023-07-06 DIAGNOSIS — K2900 Acute gastritis without bleeding: Secondary | ICD-10-CM

## 2023-07-06 MED ORDER — ALUM & MAG HYDROXIDE-SIMETH 200-200-20 MG/5ML PO SUSP
30.0000 mL | Freq: Once | ORAL | Status: AC
  Administered 2023-07-06: 30 mL via ORAL

## 2023-07-06 MED ORDER — LIDOCAINE VISCOUS HCL 2 % MT SOLN
15.0000 mL | Freq: Once | OROMUCOSAL | Status: AC
  Administered 2023-07-06: 15 mL via OROMUCOSAL

## 2023-07-06 MED ORDER — PANTOPRAZOLE SODIUM 20 MG PO TBEC: 20.0000 mg | DELAYED_RELEASE_TABLET | Freq: Every day | ORAL | 0 refills | Status: AC

## 2023-07-06 NOTE — ED Provider Notes (Signed)
[facility]    CSN: [id number] Arrival date & time: 07/06/23  0840      History   Chief Complaint No chief complaint on file.   HPI Cameron Johnson is a 37 y.o. male presents for Epigastric pain.  Patient reports 47 intermittent epigastric burning/cramping sensation that does not radiate.  He denies any nausea/vomiting/diarrhea, fevers or chills.  Denies any history of GERD or PUD.  Does have a history of ulcerative colitis but states the symptoms are not consistent with this.  He does endorse drinking more alcohol than he usually does.  Denies any other changes in diet including more fried or processed foods.  Denies any early satiety.  States sometimes the symptoms improve with eating sometimes they worsen.  He did take some Pepto-Bismol which she did not feel was beneficial.  No other concerns at this time.  HPI  Past Medical History:  Diagnosis Date   Ulcerative colitis [facility])     Patient Active Problem List   Diagnosis Date Noted   EPIDIDYMITIS 03/12/2009    History reviewed. No pertinent surgical history.     Home Medications    Prior to Admission medications   Medication Sig Start Date End Date Taking? Authorizing Provider  pantoprazole (PROTONIX) 20 MG tablet Take 1 tablet (20 mg total) by mouth daily. 07/06/23  Yes Radford Pax, NP  cyclobenzaprine (FLEXERIL) 10 MG tablet Take 1 tablet (10 mg total) by mouth 2 (two) times daily as needed for muscle spasms. 10/23/21   Theron Arista, PA-C  predniSONE (DELTASONE) 10 MG tablet 4 tablets a day for 7 days and then 3 tablets a day for 7 days then 2 tablets a day for 7 days then 1 tablet a day for 7 days. 02/18/22   Terrilee Files, MD    Family History Family History  Problem Relation Age of Onset   Healthy Mother    Healthy Father     Social History Social History   Tobacco Use   Smoking status: Former    Current packs/day: 0.00    Types: Cigarettes    Quit date: 12/30/2021    Years since quitting:  1.5    Passive exposure: Never   Smokeless tobacco: Former  Building services engineer status: Never Used  Substance Use Topics   Alcohol use: Yes    Comment: socially   Drug use: Not Currently    Types: Marijuana    Comment: Last used on 12/18/21     Allergies   Patient has no known allergies.   Review of Systems Review of Systems  Gastrointestinal:  Positive for abdominal pain.     Physical Exam Triage Vital Signs ED Triage Vitals  Encounter Vitals Group     BP 07/06/23 0849 128/68     Systolic BP Percentile --      Diastolic BP Percentile --      Pulse Rate 07/06/23 0849 62     Resp 07/06/23 0849 17     Temp 07/06/23 0849 97.9 F (36.6 C)     Temp Source 07/06/23 0849 Oral     SpO2 07/06/23 0849 97 %     Weight --      Height --      Head Circumference --      Peak Flow --      Pain Score 07/06/23 0848 5     Pain Loc --      Pain Education --  Exclude from Growth Chart --    No data found.  Updated Vital Signs BP 128/68 (BP Location: Right Arm)   Pulse 62   Temp 97.9 F (36.6 C) (Oral)   Resp 17   SpO2 97%   Visual Acuity Right Eye Distance:   Left Eye Distance:   Bilateral Distance:    Right Eye Near:   Left Eye Near:    Bilateral Near:     Physical Exam Vitals and nursing note reviewed.  Constitutional:      General: He is not in acute distress.    Appearance: Normal appearance. He is not ill-appearing, toxic-appearing or diaphoretic.  HENT:     Head: Normocephalic and atraumatic.  Eyes:     Pupils: Pupils are equal, round, and reactive to light.  Cardiovascular:     Rate and Rhythm: Normal rate.  Pulmonary:     Effort: Pulmonary effort is normal.  Abdominal:     General: Abdomen is flat. Bowel sounds are normal. There is no distension.     Palpations: Abdomen is soft. There is no mass.     Tenderness: There is no abdominal tenderness. There is no guarding or rebound.     Hernia: No hernia is present.  Skin:    General: Skin is  warm and dry.  Neurological:     General: No focal deficit present.     Mental Status: He is alert and oriented to person, place, and time.  Psychiatric:        Mood and Affect: Mood normal.        Behavior: Behavior normal.      UC Treatments / Results  Labs (all labs ordered are listed, but only abnormal results are displayed) Labs Reviewed - No data to display  EKG   Radiology No results found.  Procedures Procedures (including critical care time)  Medications Ordered in UC Medications  alum & mag hydroxide-simeth (MAALOX/MYLANTA) 200-200-20 MG/5ML suspension 30 mL (30 mLs Oral Given 07/06/23 0914)  lidocaine (XYLOCAINE) 2 % viscous mouth solution 15 mL (15 mLs Mouth/Throat Given 07/06/23 0914)    Initial Impression / Assessment and Plan / UC Course  I have reviewed the triage vital signs and the nursing notes.  Pertinent labs & imaging results that were available during my care of the patient were reviewed by me and considered in my medical decision making (see chart for details).     Reviewed symptoms and exam with patient.  No red flags.  Patient reports some mild improvement after GI cocktail.  Discussed likely gastritis possibly secondary to increased alcohol intake.  Will do short course of Protonix.  Advised to avoid alcohol, fried/spicy foods.  Stick with more bland diet and he will follow-up with his gastroenterologist if his symptoms do not improve.  ER precautions reviewed and patient verbalized understanding Final Clinical Impressions(s) / UC Diagnoses   Final diagnoses:  Epigastric pain  Acute gastritis, presence of bleeding unspecified, unspecified gastritis type     Discharge Instructions      Trial of Protonix daily for 7 to 14 days.  Try to avoid alcohol, spicy or fried foods.  Stick with more of a bland diet.  Follow-up with your gastroenterologist if your symptoms or not improving.  Please go to the ER for any worsening symptoms.  I hope you feel  better soon!     ED Prescriptions     Medication Sig Dispense Auth. Provider   pantoprazole (PROTONIX) 20 MG tablet Take 1  tablet (20 mg total) by mouth daily. 14 tablet Radford Pax, NP      PDMP not reviewed this encounter.   Radford Pax, NP 07/06/23 [phone]

## 2023-07-06 NOTE — Discharge Instructions (Signed)
Trial of Protonix daily for 7 to 14 days.  Try to avoid alcohol, spicy or fried foods.  Stick with more of a bland diet.  Follow-up with your gastroenterologist if your symptoms or not improving.  Please go to the ER for any worsening symptoms.  I hope you feel better soon!

## 2023-07-06 NOTE — ED Triage Notes (Signed)
Pt presents with c/o cramping and burning sensation in the middle of his abdomen X 4 days.   Denies n/v/d.

## 2023-09-19 IMAGING — CT CT ABD-PELV W/ CM
2 of 4 series · 16 of 46 positions shown, 18 images · IV contrast (APPLIED)
Comparison: Report only CT abdomen and pelvis 04/04/2008.

CLINICAL DATA: Left lower quadrant abdominal pain. History of
ulcerative colitis.

EXAM:
CT ABDOMEN AND PELVIS WITH CONTRAST
TECHNIQUE: Multidetector CT imaging of the abdomen and pelvis was performed
using the standard protocol following bolus administration of
intravenous contrast.

[Series 2: abd pel w · axial · 0.70mm/px · z∈[-361,+9]mm · 13 of 82 slices shown, 15 images]
[im 4/82  soft-tissue]
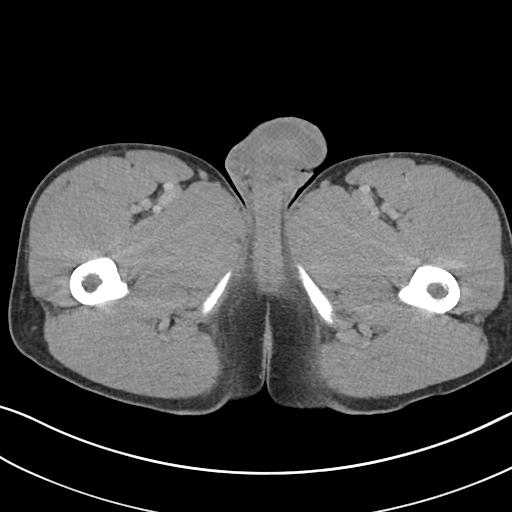
[im 4/82  bone]
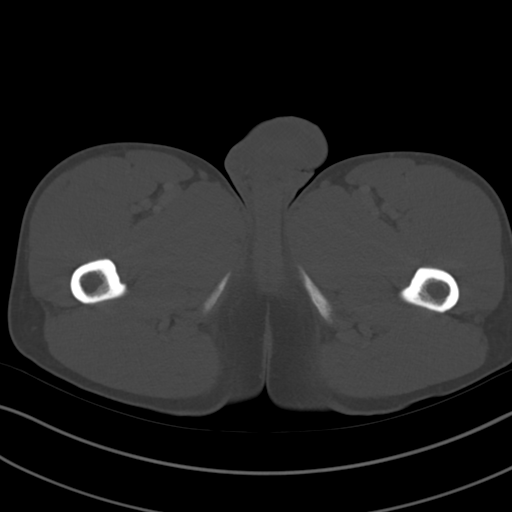
[im 11/82  soft-tissue]
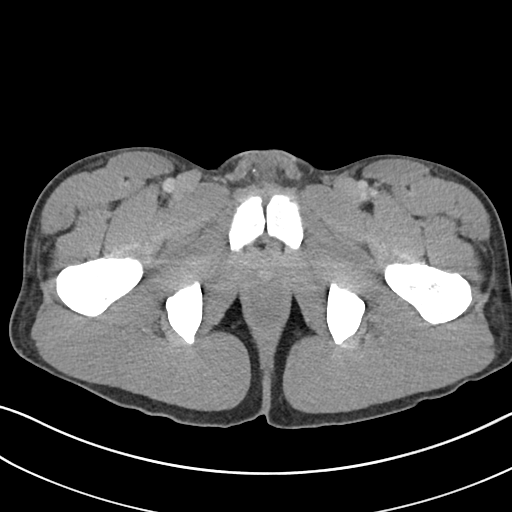
[im 17/82  soft-tissue]
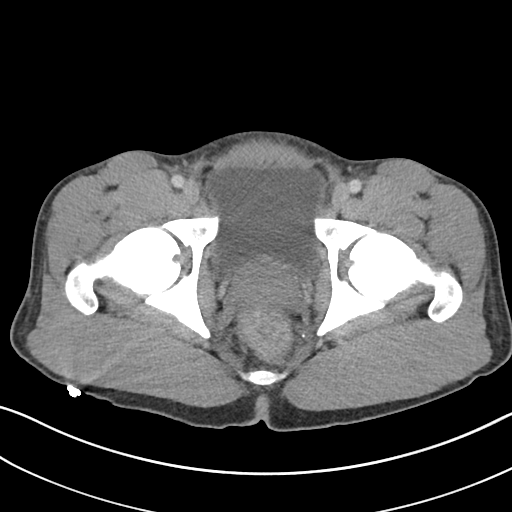
[im 24/82  soft-tissue]
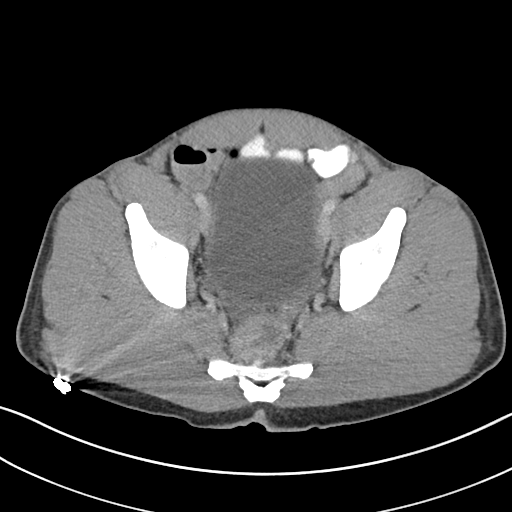
[im 28/82  soft-tissue]
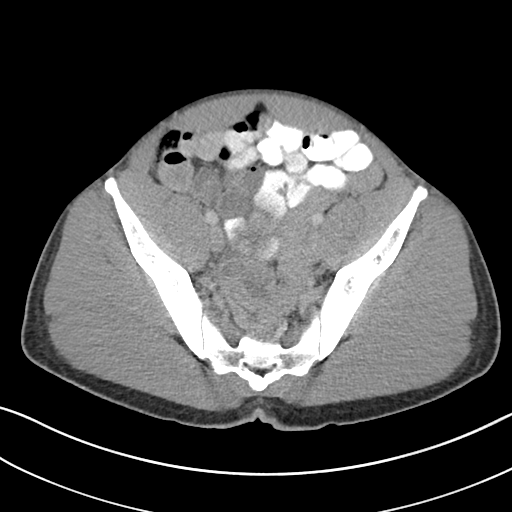
[im 34/82  soft-tissue]
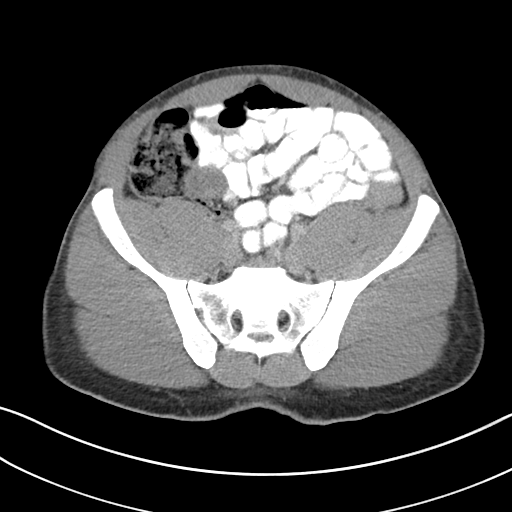
[im 41/82  soft-tissue]
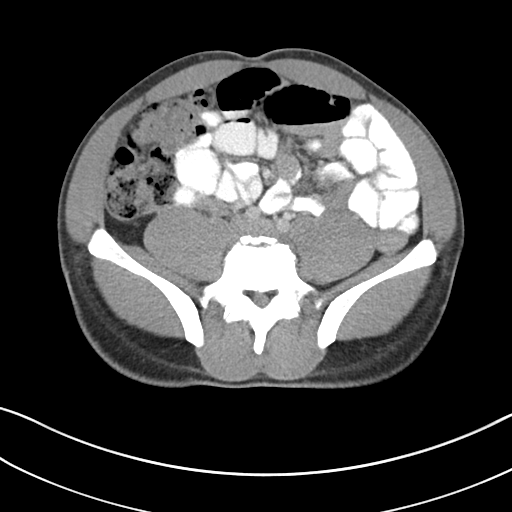
[im 48/82  soft-tissue]
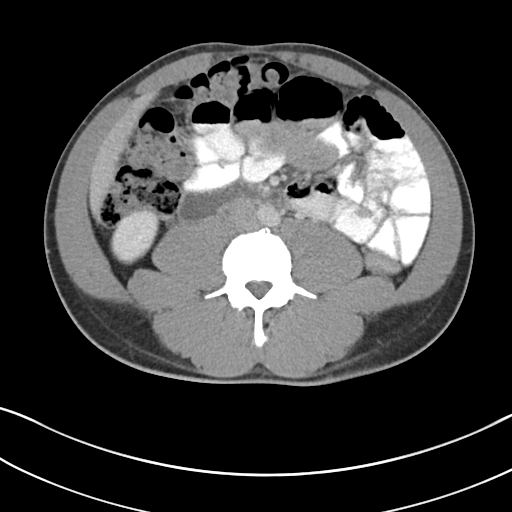
[im 55/82  soft-tissue]
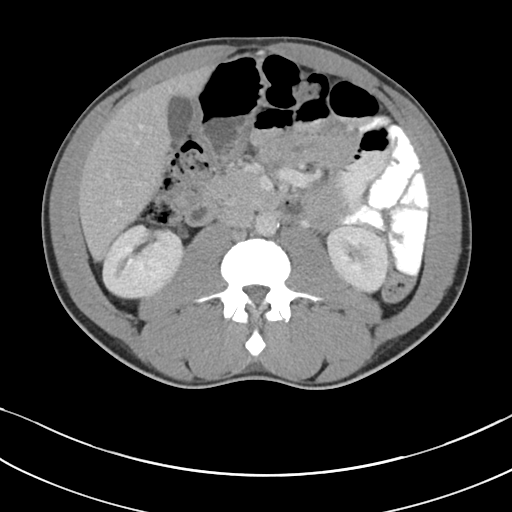
[im 55/82  bone]
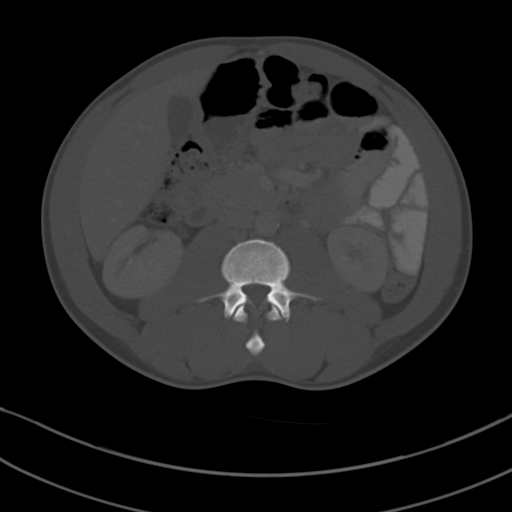
[im 58/82  soft-tissue]
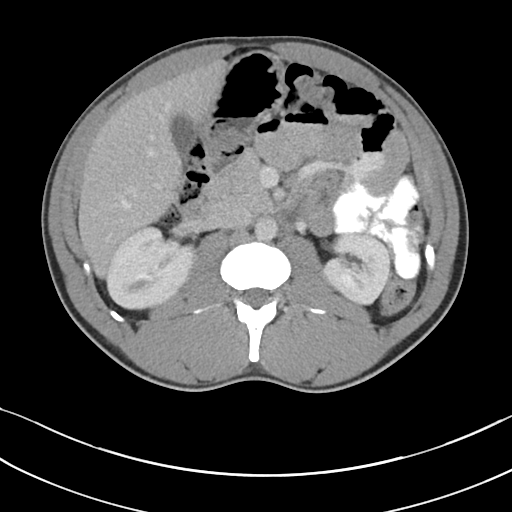
[im 65/82  soft-tissue]
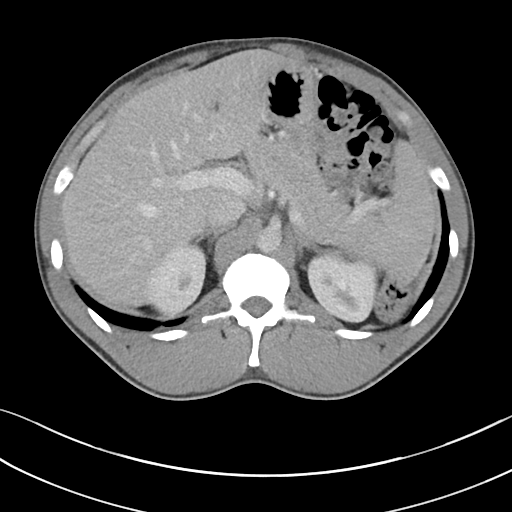
[im 71/82  soft-tissue]
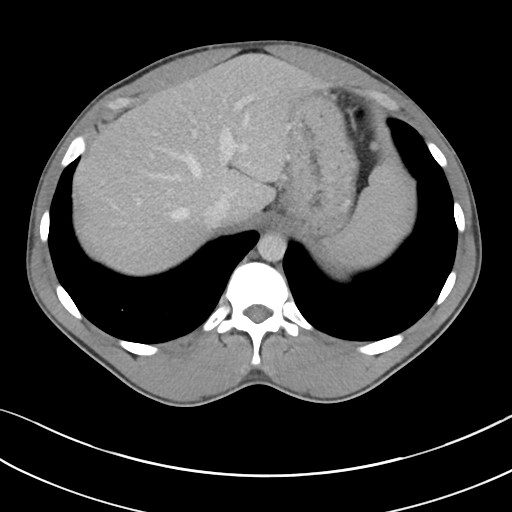
[im 78/82  soft-tissue]
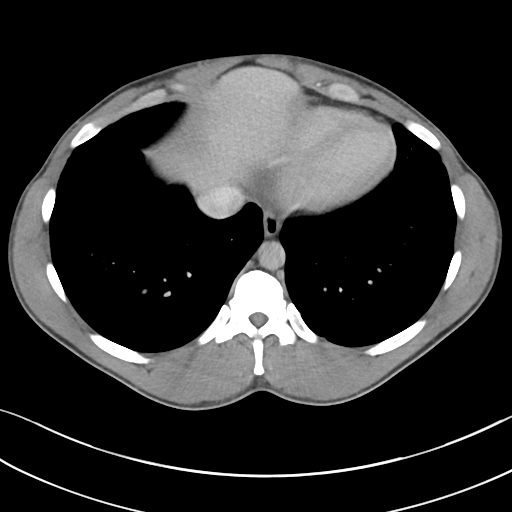

[Series 5: coronal · coronal · 0.78mm/px · 3 of 95 slices shown]
[im 32/95  soft-tissue]
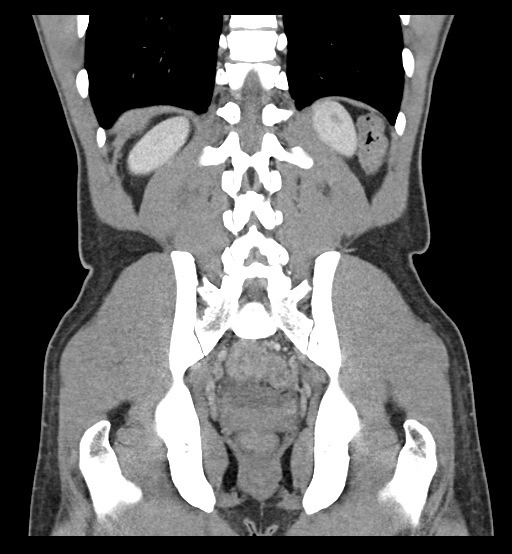
[im 42/95  soft-tissue]
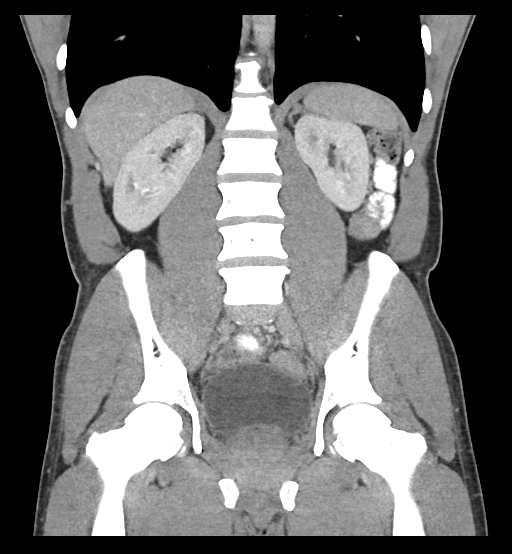
[im 53/95  soft-tissue]
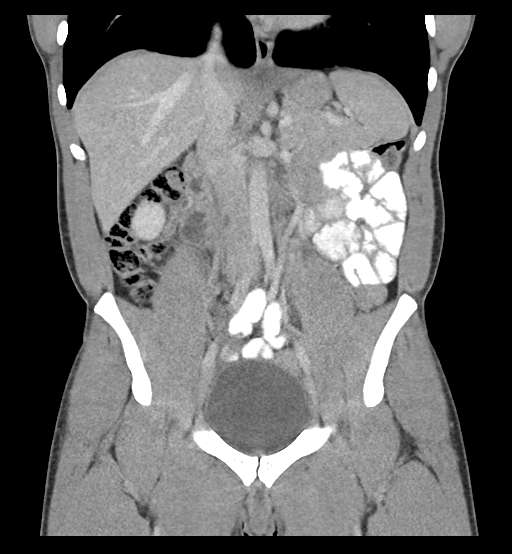

[16 of 46 positions shown; findings below may reference images not displayed]

RADIATION DOSE REDUCTION: This exam was performed according to the
departmental dose-optimization program which includes automated
exposure control, adjustment of the mA and/or kV according to
patient size and/or use of iterative reconstruction technique.

CONTRAST:  100mL OMNIPAQUE IOHEXOL 300 MG/ML  SOLN
FINDINGS: Lower chest: No acute abnormality.

Hepatobiliary: No focal liver abnormality is seen. No gallstones,
gallbladder wall thickening, or biliary dilatation.

Pancreas: Unremarkable. No pancreatic ductal dilatation or
surrounding inflammatory changes.

Spleen: Normal in size without focal abnormality.

Adrenals/Urinary Tract: Adrenal glands are unremarkable. Kidneys are
normal, without renal calculi, focal lesion, or hydronephrosis.
Bladder is unremarkable.

Stomach/Bowel: Stomach is within normal limits. Appendix appears
normal. No evidence of bowel wall thickening, distention, or
inflammatory changes. There is some air-fluid levels within proximal
small bowel loops. Can not exclude wall thickening of the descending
colon, sigmoid colon and rectum versus normal under distension.

Vascular/Lymphatic: No significant vascular findings are present. No
enlarged abdominal or pelvic lymph nodes.

Reproductive: Prostate is unremarkable.

Other: There is trace free fluid in the pelvis. No abdominal wall
hernia.

Musculoskeletal: No acute fracture.
IMPRESSION: 1. Small amount of free fluid in the pelvis of uncertain etiology,
but abnormal in this male patient.
2. Air-fluid levels in the proximal small bowel may represent
enteritis.
3. Can not exclude wall thickening of the descending colon to the
level of the rectum versus normal under distension. Correlate for
colitis.

## 2024-01-19 ENCOUNTER — Other Ambulatory Visit: Payer: Self-pay

## 2024-01-19 ENCOUNTER — Encounter (HOSPITAL_BASED_OUTPATIENT_CLINIC_OR_DEPARTMENT_OTHER): Payer: Self-pay | Admitting: Emergency Medicine

## 2024-01-19 ENCOUNTER — Emergency Department (HOSPITAL_BASED_OUTPATIENT_CLINIC_OR_DEPARTMENT_OTHER)
Admission: EM | Admit: 2024-01-19 | Discharge: 2024-01-19 | Disposition: A | Discharge status: Home / Self Care | Payer: Self-pay | Attending: Emergency Medicine | Admitting: Emergency Medicine

## 2024-01-19 DIAGNOSIS — J019 Acute sinusitis, unspecified: Secondary | ICD-10-CM | POA: Insufficient documentation

## 2024-01-19 DIAGNOSIS — Z20822 Contact with and (suspected) exposure to covid-19: Secondary | ICD-10-CM | POA: Insufficient documentation

## 2024-01-19 DIAGNOSIS — B9689 Other specified bacterial agents as the cause of diseases classified elsewhere: Secondary | ICD-10-CM

## 2024-01-19 LAB — RESP PANEL BY RT-PCR (RSV, FLU A&B, COVID)  RVPGX2
Influenza A by PCR: NEGATIVE
Influenza B by PCR: NEGATIVE
Resp Syncytial Virus by PCR: NEGATIVE
SARS Coronavirus 2 by RT PCR: NEGATIVE

## 2024-01-19 MED ORDER — AMOXICILLIN-POT CLAVULANATE 875-125 MG PO TABS: 1.0000 | ORAL_TABLET | Freq: Two times a day (BID) | ORAL | 0 refills | Status: AC

## 2024-01-19 NOTE — ED Triage Notes (Signed)
Pt arrived from home with c/o sniffling and having post-nasal drip. Afebrile. Has cavity and recently lost insurance.

## 2024-01-19 NOTE — Discharge Instructions (Addendum)
You were seen in the ER today for concerns of a possible sinus infection. With your symptoms ongoing for about 2 weeks and abnormal smell, I would recommend treating this as a sinus infection with antibiotics. I have sent a prescription for Augmentin to your pharmacy which you will take twice daily for 7 days. Take this with food and possibly add in a probiotic to help with any possible stomach symptoms the antibiotic may cause.

## 2024-01-26 NOTE — ED Provider Notes (Signed)
[place] EMERGENCY DEPARTMENT AT [facility] Provider Note   CSN: [id number] Arrival date & time: 01/19/24  1659     History Chief Complaint  Patient presents with   Sinusitis    Cameron Johnson is a 38 y.o. male. Patient presents to the ED with concerns of sinusitis. States that he occassionally gets sinus infections, but somewhat unpredictable. Endorses recent rhinorrhea and post-nasal drip, but no fever, facial pain, headaches, or sick contacts. Current symptoms ongoing for about 8 days. Denies shortness of breath or chest pain.   Sinusitis Associated symptoms: rhinorrhea        Home Medications Prior to Admission medications   Medication Sig Start Date End Date Taking? Authorizing Provider  amoxicillin-clavulanate (AUGMENTIN) 875-125 MG tablet Take 1 tablet by mouth every 12 (twelve) hours. 01/19/24  Yes Smitty Knudsen, PA-C  cyclobenzaprine (FLEXERIL) 10 MG tablet Take 1 tablet (10 mg total) by mouth 2 (two) times daily as needed for muscle spasms. 10/23/21   Theron Arista, PA-C  pantoprazole (PROTONIX) 20 MG tablet Take 1 tablet (20 mg total) by mouth daily. 07/06/23   Radford Pax, NP  predniSONE (DELTASONE) 10 MG tablet 4 tablets a day for 7 days and then 3 tablets a day for 7 days then 2 tablets a day for 7 days then 1 tablet a day for 7 days. 02/18/22   Terrilee Files, MD      Allergies    Patient has no known allergies.    Review of Systems   Review of Systems  HENT:  Positive for rhinorrhea.   All other systems reviewed and are negative.   Physical Exam Updated Vital Signs BP 121/68   Pulse 76   Temp 97.9 F (36.6 C)   Resp 18   Ht 5\' 9"  (1.753 m)   Wt 81.6 kg   SpO2 100%   BMI 26.58 kg/m  Physical Exam Vitals and nursing note reviewed.  Constitutional:      General: He is not in acute distress.    Appearance: He is well-developed.  HENT:     Head: Normocephalic and atraumatic.     Comments: TTP to the maxillary sinuses Eyes:      Conjunctiva/sclera: Conjunctivae normal.  Cardiovascular:     Rate and Rhythm: Normal rate and regular rhythm.     Heart sounds: No murmur heard. Pulmonary:     Effort: Pulmonary effort is normal. No respiratory distress.     Breath sounds: Normal breath sounds.  Abdominal:     Palpations: Abdomen is soft.     Tenderness: There is no abdominal tenderness.  Musculoskeletal:        General: No swelling.     Cervical back: Neck supple.  Skin:    General: Skin is warm and dry.     Capillary Refill: Capillary refill takes less than 2 seconds.  Neurological:     Mental Status: He is alert.  Psychiatric:        Mood and Affect: Mood normal.     ED Results / Procedures / Treatments   Labs (all labs ordered are listed, but only abnormal results are displayed) Labs Reviewed  RESP PANEL BY RT-PCR (RSV, FLU A&B, COVID)  [id number]    EKG None  Radiology No results found.  Procedures Procedures    Medications Ordered in ED Medications - No data to display  ED Course/ Medical Decision Making/ A&P  Medical Decision Making Risk Prescription drug management.   This patient presents to the ED for concern of sinusitis.  Differential diagnosis includes viral sinusitis, pharyngitis, bacterial sinus infection, viral URI   Lab Tests:  I Ordered, and personally interpreted labs.  The pertinent results include: Respiratory panel negative for COVID-19, influenza and RSV   Problem List / ED Course:  Patient presents to the emergency department concerns of sinusitis.  He endorses he has had some increasing rhinorrhea and postnasal drip.  This is atypical for him.  He states that he occasionally will get sinus infections but this is not a common occurrence for him.  He has been afebrile recently.  Does endorse some mild head pressure with denies any headaches.  No sick contacts recently. On exam, there is some mild tenderness to palpation of the maxillary  sinuses.  No evident oropharyngeal erythema or exudate.  Heart lung sounds are normal.  Will obtain respiratory viral panel for evaluation. Respiratory viral panel is negative.  Given patient's symptoms have been present for approximately 8 days, I suspect that he would benefit from antibiotic therapy as he has not had any notable improvement has had some slight decline in his symptoms.  I have sent a prescription for Augmentin to his pharmacy.  Advised patient to return to the emergency department if he has any new or worsening symptoms.  Otherwise encourage close follow-up with PCP.  Patient discharged home in stable condition.  Final Clinical Impression(s) / ED Diagnoses Final diagnoses:  Acute bacterial sinusitis    Rx / DC Orders ED Discharge Orders          Ordered    amoxicillin-clavulanate (AUGMENTIN) 875-125 MG tablet  Every 12 hours        01/19/24 2049              Smitty Knudsen, PA-C 01/27/24 0027    Franne Forts, DO 02/03/24 [phone]

## 2024-02-27 ENCOUNTER — Other Ambulatory Visit: Payer: Self-pay

## 2024-02-27 ENCOUNTER — Emergency Department (HOSPITAL_BASED_OUTPATIENT_CLINIC_OR_DEPARTMENT_OTHER)
Admission: EM | Admit: 2024-02-27 | Discharge: 2024-02-27 | Disposition: A | Discharge status: Home / Self Care | Attending: Emergency Medicine | Admitting: Emergency Medicine

## 2024-02-27 ENCOUNTER — Encounter (HOSPITAL_BASED_OUTPATIENT_CLINIC_OR_DEPARTMENT_OTHER): Payer: Self-pay | Admitting: Emergency Medicine

## 2024-02-27 DIAGNOSIS — K5180 Other ulcerative colitis without complications: Secondary | ICD-10-CM | POA: Diagnosis not present

## 2024-02-27 DIAGNOSIS — R1032 Left lower quadrant pain: Secondary | ICD-10-CM | POA: Diagnosis present

## 2024-02-27 DIAGNOSIS — K5190 Ulcerative colitis, unspecified, without complications: Secondary | ICD-10-CM | POA: Diagnosis not present

## 2024-02-27 LAB — COMPREHENSIVE METABOLIC PANEL
ALT: 22 U/L (ref 0–44)
AST: 23 U/L (ref 15–41)
Albumin: 4.9 g/dL (ref 3.5–5.0)
Alkaline Phosphatase: 72 U/L (ref 38–126)
Anion gap: 8 (ref 5–15)
BUN: 6 mg/dL (ref 6–20)
CO2: 26 mmol/L (ref 22–32)
Calcium: 9.5 mg/dL (ref 8.9–10.3)
Chloride: 104 mmol/L (ref 98–111)
Creatinine, Ser: 1.06 mg/dL (ref 0.61–1.24)
GFR, Estimated: >60 mL/min (ref >60)
Glucose, Bld: 103 mg/dL — ABNORMAL HIGH (ref 70–99)
Potassium: 3.6 mmol/L (ref 3.5–5.1)
Sodium: 138 mmol/L (ref 135–145)
Total Bilirubin: 0.5 mg/dL (ref 0.0–1.2)
Total Protein: 7.8 g/dL (ref 6.5–8.1)

## 2024-02-27 LAB — URINALYSIS, ROUTINE W REFLEX MICROSCOPIC
Bilirubin Urine: NEGATIVE
Glucose, UA: NEGATIVE mg/dL
Hgb urine dipstick: NEGATIVE
Ketones, ur: NEGATIVE mg/dL
Leukocytes,Ua: NEGATIVE
Nitrite: NEGATIVE
Protein, ur: NEGATIVE mg/dL
Specific Gravity, Urine: 1.01 (ref 1.005–1.030)
pH: 6 (ref 5.0–8.0)

## 2024-02-27 LAB — CBC
HCT: 43.1 % (ref 39.0–52.0)
Hemoglobin: 15.2 g/dL (ref 13.0–17.0)
MCH: 31.1 pg (ref 26.0–34.0)
MCHC: 35.3 g/dL (ref 30.0–36.0)
MCV: 88.1 fL (ref 80.0–100.0)
Platelets: 280 10*3/uL (ref 150–400)
RBC: 4.89 MIL/uL (ref 4.22–5.81)
RDW: 11.9 % (ref 11.5–15.5)
WBC: 6.4 10*3/uL (ref 4.0–10.5)
nRBC: 0 % (ref 0.0–0.2)

## 2024-02-27 LAB — LIPASE, BLOOD: Lipase: 33 U/L (ref 11–51)

## 2024-02-27 MED ORDER — PREDNISONE 10 MG PO TABS: ORAL_TABLET | ORAL | 0 refills | Status: AC

## 2024-02-27 MED ORDER — ONDANSETRON 4 MG PO TBDP: 4.0000 mg | ORAL_TABLET | Freq: Three times a day (TID) | ORAL | 0 refills | Status: DC | PRN

## 2024-02-27 MED ORDER — ONDANSETRON 4 MG PO TBDP: 4.0000 mg | ORAL_TABLET | Freq: Three times a day (TID) | ORAL | 0 refills | Status: AC | PRN

## 2024-02-27 MED ORDER — ONDANSETRON 4 MG PO TBDP
4.0000 mg | ORAL_TABLET | Freq: Once | ORAL | Status: AC
Start: 2024-02-27 — End: 2024-02-27
  Administered 2024-02-27: 4 mg via ORAL
  Filled 2024-02-27: qty 1

## 2024-02-27 MED ORDER — PREDNISONE 10 MG PO TABS: ORAL_TABLET | ORAL | 0 refills | Status: DC

## 2024-02-27 NOTE — Discharge Instructions (Addendum)
 Please follow-up outpatient with gastroenterology in the area (can try [facility]) for further management of your ulcerative colitis.  Regarding your symptoms, you may be experiencing a mild flare.  Your abdominal exam is overall reassuring and you are tolerating oral intake therefore you are safe for outpatient management.  Return for any severe worsening symptoms, fever, chills, severe abdominal pain, nausea and vomiting and inability to tolerate oral intake that would prompt further evaluation with CT imaging and potentially require need for hospitalization.

## 2024-02-27 NOTE — ED Provider Notes (Signed)
 Cameron Johnson AT [facility] Provider Note   CSN: [id number] Arrival date & time: 02/27/24  1904     History  Chief Complaint  Patient presents with   Abdominal Pain    Cameron Johnson is a 38 y.o. male.   Abdominal Pain Associated symptoms: nausea      38 year old male with medical history significant for ulcerative colitis who presents with roughly 1 week of crampy left lower quadrant abdominal discomfort.  He states that he recently moved from [place] and has not yet followed up with a gastroenterologist in the area.  He endorses some nausea with associated loose stools and slightly bloody stools.  He is overall tolerating oral intake.  He has been managing his pain with Tylenol and declines pain medication.  He request steroids and antiemetics for management of what he feels to be a mild flare.  Home Medications Prior to Admission medications   Medication Sig Start Date End Date Taking? Authorizing Provider  ondansetron (ZOFRAN-ODT) 4 MG disintegrating tablet Take 1 tablet (4 mg total) by mouth every 8 (eight) hours as needed. 02/27/24  Yes Ernie Avena, MD  predniSONE (DELTASONE) 10 MG tablet Take 6 tablets (60 mg total) by mouth daily for 7 days, THEN 4 tablets (40 mg total) daily for 4 days, THEN 2 tablets (20 mg total) daily for 3 days. 02/27/24 03/12/24 Yes Ernie Avena, MD  amoxicillin-clavulanate (AUGMENTIN) 875-125 MG tablet Take 1 tablet by mouth every 12 (twelve) hours. 01/19/24   Smitty Knudsen, PA-C  cyclobenzaprine (FLEXERIL) 10 MG tablet Take 1 tablet (10 mg total) by mouth 2 (two) times daily as needed for muscle spasms. 10/23/21   Theron Arista, PA-C  pantoprazole (PROTONIX) 20 MG tablet Take 1 tablet (20 mg total) by mouth daily. 07/06/23   Radford Pax, NP      Allergies    Patient has no known allergies.    Review of Systems   Review of Systems  Gastrointestinal:  Positive for abdominal pain and nausea.  All other systems  reviewed and are negative.   Physical Exam Updated Vital Signs BP 121/83 (BP Location: Right Arm)   Pulse 83   Temp 98.6 F (37 C) (Oral)   Resp 18   SpO2 97%  Physical Exam Vitals and nursing note reviewed.  Constitutional:      General: He is not in acute distress.    Appearance: He is well-developed.  HENT:     Head: Normocephalic and atraumatic.  Eyes:     Conjunctiva/sclera: Conjunctivae normal.  Cardiovascular:     Rate and Rhythm: Normal rate and regular rhythm.     Heart sounds: No murmur heard. Pulmonary:     Effort: Pulmonary effort is normal. No respiratory distress.     Breath sounds: Normal breath sounds.  Abdominal:     Palpations: Abdomen is soft.     Tenderness: There is no abdominal tenderness. There is no guarding or rebound.  Musculoskeletal:        General: No swelling.     Cervical back: Neck supple.  Skin:    General: Skin is warm and dry.     Capillary Refill: Capillary refill takes less than 2 seconds.  Neurological:     Mental Status: He is alert.  Psychiatric:        Mood and Affect: Mood normal.     ED Results / Procedures / Treatments   Labs (all labs ordered are listed, but only abnormal results  are displayed) Labs Reviewed  COMPREHENSIVE METABOLIC PANEL - Abnormal; Notable for the following components:      Result Value   Glucose, Bld 103 (*)    All other components within normal limits  LIPASE, BLOOD  CBC  URINALYSIS, ROUTINE W REFLEX MICROSCOPIC    EKG None  Radiology No results found.  Procedures Procedures    Medications Ordered in ED Medications  ondansetron (ZOFRAN-ODT) disintegrating tablet 4 mg (has no administration in time range)    ED Course/ Medical Decision Making/ A&P                                 Medical Decision Making Amount and/or Complexity of Data Reviewed Labs: ordered.  Risk Prescription drug management.    38 year old male with medical history significant for ulcerative colitis who  presents with roughly 1 week of crampy left lower quadrant abdominal discomfort.  He states that he recently moved from [place] and has not yet followed up with a gastroenterologist in the area.  He endorses some nausea with associated loose stools and slightly bloody stools.  He is overall tolerating oral intake.  He has been managing his pain with Tylenol and declines pain medication.  He request steroids and antiemetics for management of what he feels to be a mild flare.  On arrival, the patient was vitally stable, afebrile, not tachycardic or tachypneic, saturating well on room air.  Physical exam revealed an abdomen that was nontender, soft, no rebound or guarding.  Suspect likely mild flare of the patient's inflammatory bowel disease.   Laboratory evaluation revealed CMP that was unremarkable, lipase, CBC, urinalysis all negative.  The patient was provided Zofran for nausea.  He is tolerating oral intake and his pain is well-controlled.  He has reassuring vitals and a reassuring exam.  Do not think that the patient requires further diagnostic workup in the emergency Johnson with CT imaging at this time.  Suspect likely mild flare of his IBD for which we will treat with a course of prednisone.  The patient states that he already has mesalamine at home that he can take.    A referral for outpatient follow-up with gastroenterology was placed.  Patient stable for discharge, prednisone and Zofran prescribed, return precautions provided in the event of any severe worsening symptoms.  Final Clinical Impression(s) / ED Diagnoses Final diagnoses:  Other ulcerative colitis without complication [facility])    Rx / DC Orders ED Discharge Orders          Ordered    Ambulatory referral to Gastroenterology        02/27/24 2332    predniSONE (DELTASONE) 10 MG tablet  Daily        02/27/24 2340    ondansetron (ZOFRAN-ODT) 4 MG disintegrating tablet  Every 8 hours PRN        02/27/24 2340               Ernie Avena, MD 02/27/24 2341

## 2024-02-27 NOTE — ED Notes (Signed)
 ED Provider at bedside.

## 2024-02-27 NOTE — ED Triage Notes (Signed)
 LLQ abdo pain Notice pain Thursday Hx ulcerative colitis Moved from [place] unable to establish with GI  Some nausea, some diarrhea and blood in stool

## 2024-04-26 DIAGNOSIS — K5100 Ulcerative (chronic) pancolitis without complications: Secondary | ICD-10-CM | POA: Diagnosis not present
